# Patient Record
Sex: Female | Born: 1935 | Race: White | Hispanic: No | Smoking: Never smoker
Health system: Southern US, Community
[De-identification: ages and names within clinical notes are randomized; demographics above are authoritative.]

## PROBLEM LIST (undated history)

## (undated) DIAGNOSIS — I5032 Chronic diastolic (congestive) heart failure: Secondary | ICD-10-CM

## (undated) DIAGNOSIS — N183 Chronic kidney disease, stage 3 unspecified: Secondary | ICD-10-CM

## (undated) DIAGNOSIS — T4145XA Adverse effect of unspecified anesthetic, initial encounter: Secondary | ICD-10-CM

## (undated) DIAGNOSIS — I10 Essential (primary) hypertension: Secondary | ICD-10-CM

## (undated) DIAGNOSIS — R7303 Prediabetes: Secondary | ICD-10-CM

## (undated) DIAGNOSIS — T8859XA Other complications of anesthesia, initial encounter: Secondary | ICD-10-CM

## (undated) DIAGNOSIS — R600 Localized edema: Secondary | ICD-10-CM

## (undated) DIAGNOSIS — R011 Cardiac murmur, unspecified: Secondary | ICD-10-CM

## (undated) DIAGNOSIS — R112 Nausea with vomiting, unspecified: Secondary | ICD-10-CM

## (undated) DIAGNOSIS — Z9889 Other specified postprocedural states: Secondary | ICD-10-CM

## (undated) DIAGNOSIS — K7460 Unspecified cirrhosis of liver: Secondary | ICD-10-CM

## (undated) DIAGNOSIS — I350 Nonrheumatic aortic (valve) stenosis: Secondary | ICD-10-CM

## (undated) DIAGNOSIS — D638 Anemia in other chronic diseases classified elsewhere: Secondary | ICD-10-CM

## (undated) DIAGNOSIS — G252 Other specified forms of tremor: Secondary | ICD-10-CM

## (undated) DIAGNOSIS — Z8701 Personal history of pneumonia (recurrent): Secondary | ICD-10-CM

## (undated) DIAGNOSIS — R259 Unspecified abnormal involuntary movements: Secondary | ICD-10-CM

## (undated) DIAGNOSIS — M069 Rheumatoid arthritis, unspecified: Secondary | ICD-10-CM

## (undated) HISTORY — DX: Unspecified cirrhosis of liver: K74.60

## (undated) HISTORY — DX: Nonrheumatic aortic (valve) stenosis: I35.0

## (undated) HISTORY — DX: Rheumatoid arthritis, unspecified: M06.9

## (undated) HISTORY — DX: Chronic kidney disease, stage 3 unspecified: N18.30

## (undated) HISTORY — PX: BREAST LUMPECTOMY: SHX2

## (undated) HISTORY — DX: Personal history of pneumonia (recurrent): Z87.01

## (undated) HISTORY — DX: Chronic kidney disease, stage 3 (moderate): N18.3

## (undated) HISTORY — DX: Unspecified abnormal involuntary movements: R25.9

## (undated) HISTORY — DX: Other specified forms of tremor: G25.2

## (undated) HISTORY — DX: Anemia in other chronic diseases classified elsewhere: D63.8

## (undated) HISTORY — DX: Localized edema: R60.0

## (undated) HISTORY — DX: Essential (primary) hypertension: I10

## (undated) HISTORY — DX: Chronic diastolic (congestive) heart failure: I50.32

## (undated) HISTORY — PX: ABDOMINAL HYSTERECTOMY: SHX81

---

## 1998-05-20 ENCOUNTER — Encounter: Payer: Self-pay | Admitting: *Deleted

## 1998-05-20 ENCOUNTER — Ambulatory Visit (HOSPITAL_COMMUNITY): Admission: RE | Admit: 1998-05-20 | Discharge: 1998-05-20 | Payer: Self-pay | Admitting: *Deleted

## 1998-11-26 ENCOUNTER — Emergency Department (HOSPITAL_COMMUNITY): Admission: EM | Admit: 1998-11-26 | Discharge: 1998-11-26 | Payer: Self-pay | Admitting: Emergency Medicine

## 1998-12-27 ENCOUNTER — Encounter: Admission: RE | Admit: 1998-12-27 | Discharge: 1998-12-27 | Payer: Self-pay | Admitting: Family Medicine

## 1998-12-27 ENCOUNTER — Encounter: Payer: Self-pay | Admitting: Family Medicine

## 1999-06-28 ENCOUNTER — Encounter: Admission: RE | Admit: 1999-06-28 | Discharge: 1999-06-28 | Payer: Self-pay | Admitting: Family Medicine

## 1999-06-28 ENCOUNTER — Encounter: Payer: Self-pay | Admitting: Family Medicine

## 2000-03-14 ENCOUNTER — Other Ambulatory Visit: Admission: RE | Admit: 2000-03-14 | Discharge: 2000-03-14 | Payer: Self-pay | Admitting: *Deleted

## 2000-03-20 ENCOUNTER — Encounter: Payer: Self-pay | Admitting: *Deleted

## 2000-03-20 ENCOUNTER — Ambulatory Visit (HOSPITAL_COMMUNITY): Admission: RE | Admit: 2000-03-20 | Discharge: 2000-03-20 | Payer: Self-pay | Admitting: *Deleted

## 2002-05-01 ENCOUNTER — Ambulatory Visit (HOSPITAL_COMMUNITY): Admission: RE | Admit: 2002-05-01 | Discharge: 2002-05-01 | Payer: Self-pay | Admitting: Family Medicine

## 2004-09-03 ENCOUNTER — Emergency Department (HOSPITAL_COMMUNITY): Admission: EM | Admit: 2004-09-03 | Discharge: 2004-09-04 | Payer: Self-pay | Admitting: Emergency Medicine

## 2008-03-04 ENCOUNTER — Encounter: Admission: RE | Admit: 2008-03-04 | Discharge: 2008-03-04 | Payer: Self-pay | Admitting: Gastroenterology

## 2008-06-24 ENCOUNTER — Ambulatory Visit (HOSPITAL_COMMUNITY): Admission: RE | Admit: 2008-06-24 | Discharge: 2008-06-24 | Payer: Self-pay | Admitting: Family Medicine

## 2008-06-24 ENCOUNTER — Ambulatory Visit: Payer: Self-pay | Admitting: Vascular Surgery

## 2009-10-27 ENCOUNTER — Ambulatory Visit: Payer: Self-pay | Admitting: Internal Medicine

## 2010-01-21 ENCOUNTER — Ambulatory Visit: Payer: Self-pay | Admitting: Cardiology

## 2010-01-29 ENCOUNTER — Encounter: Payer: Self-pay | Admitting: Family Medicine

## 2010-02-03 ENCOUNTER — Ambulatory Visit: Payer: Self-pay | Admitting: Cardiology

## 2010-02-16 ENCOUNTER — Ambulatory Visit: Payer: Self-pay | Admitting: Cardiology

## 2010-02-23 ENCOUNTER — Other Ambulatory Visit: Payer: Self-pay

## 2010-02-23 ENCOUNTER — Encounter (HOSPITAL_COMMUNITY): Payer: Self-pay

## 2010-02-23 ENCOUNTER — Encounter (HOSPITAL_COMMUNITY): Payer: Medicare HMO | Attending: Nephrology

## 2010-02-23 DIAGNOSIS — N183 Chronic kidney disease, stage 3 unspecified: Secondary | ICD-10-CM | POA: Insufficient documentation

## 2010-02-23 DIAGNOSIS — D638 Anemia in other chronic diseases classified elsewhere: Secondary | ICD-10-CM | POA: Insufficient documentation

## 2010-02-23 LAB — POCT HEMOGLOBIN-HEMACUE: Hemoglobin: 9 g/dL — ABNORMAL LOW (ref 12.0–15.0)

## 2010-03-09 ENCOUNTER — Encounter (HOSPITAL_COMMUNITY): Payer: Medicare HMO

## 2010-03-09 ENCOUNTER — Other Ambulatory Visit: Payer: Self-pay

## 2010-03-10 LAB — POCT HEMOGLOBIN-HEMACUE: Hemoglobin: 8.9 g/dL — ABNORMAL LOW (ref 12.0–15.0)

## 2010-03-23 ENCOUNTER — Encounter (HOSPITAL_COMMUNITY): Payer: Medicare HMO

## 2010-03-25 ENCOUNTER — Encounter (HOSPITAL_COMMUNITY): Payer: Medicare HMO | Attending: Nephrology

## 2010-03-25 ENCOUNTER — Other Ambulatory Visit: Payer: Self-pay | Admitting: Nephrology

## 2010-03-25 ENCOUNTER — Other Ambulatory Visit: Payer: Self-pay

## 2010-03-25 DIAGNOSIS — N183 Chronic kidney disease, stage 3 unspecified: Secondary | ICD-10-CM | POA: Insufficient documentation

## 2010-03-25 DIAGNOSIS — D638 Anemia in other chronic diseases classified elsewhere: Secondary | ICD-10-CM | POA: Insufficient documentation

## 2010-03-25 LAB — IRON AND TIBC
Iron: 66 ug/dL (ref 42–135)
Saturation Ratios: 29 % (ref 20–55)
TIBC: 224 ug/dL — ABNORMAL LOW (ref 250–470)

## 2010-04-11 ENCOUNTER — Other Ambulatory Visit: Payer: Self-pay | Admitting: Nephrology

## 2010-04-11 ENCOUNTER — Encounter (HOSPITAL_COMMUNITY): Payer: Medicare HMO | Attending: Nephrology

## 2010-04-11 DIAGNOSIS — N183 Chronic kidney disease, stage 3 unspecified: Secondary | ICD-10-CM | POA: Insufficient documentation

## 2010-04-11 DIAGNOSIS — D638 Anemia in other chronic diseases classified elsewhere: Secondary | ICD-10-CM | POA: Insufficient documentation

## 2010-04-18 ENCOUNTER — Other Ambulatory Visit: Payer: Self-pay | Admitting: Nephrology

## 2010-04-18 ENCOUNTER — Encounter (HOSPITAL_COMMUNITY): Payer: Medicare HMO

## 2010-04-25 ENCOUNTER — Encounter (HOSPITAL_COMMUNITY): Payer: Medicare HMO

## 2010-04-25 ENCOUNTER — Other Ambulatory Visit: Payer: Self-pay | Admitting: Nephrology

## 2010-05-02 ENCOUNTER — Other Ambulatory Visit: Payer: Self-pay | Admitting: Nephrology

## 2010-05-02 ENCOUNTER — Encounter (HOSPITAL_COMMUNITY): Payer: Medicare HMO

## 2010-05-02 LAB — FERRITIN: Ferritin: 24 ng/mL (ref 10–291)

## 2010-05-02 LAB — IRON AND TIBC
Iron: 23 ug/dL — ABNORMAL LOW (ref 42–135)
TIBC: 261 ug/dL (ref 250–470)
UIBC: 238 ug/dL

## 2010-05-05 ENCOUNTER — Other Ambulatory Visit: Payer: Self-pay | Admitting: Cardiology

## 2010-05-05 DIAGNOSIS — I10 Essential (primary) hypertension: Secondary | ICD-10-CM

## 2010-05-06 NOTE — Telephone Encounter (Signed)
escribe medication per fax request  

## 2010-05-09 ENCOUNTER — Encounter (HOSPITAL_COMMUNITY): Payer: Medicare HMO

## 2010-05-09 ENCOUNTER — Other Ambulatory Visit: Payer: Self-pay | Admitting: Nephrology

## 2010-05-16 ENCOUNTER — Other Ambulatory Visit: Payer: Self-pay | Admitting: Nephrology

## 2010-05-16 ENCOUNTER — Encounter (HOSPITAL_COMMUNITY): Payer: Medicare HMO | Attending: Nephrology

## 2010-05-16 DIAGNOSIS — N183 Chronic kidney disease, stage 3 unspecified: Secondary | ICD-10-CM | POA: Insufficient documentation

## 2010-05-16 DIAGNOSIS — D638 Anemia in other chronic diseases classified elsewhere: Secondary | ICD-10-CM | POA: Insufficient documentation

## 2010-05-17 LAB — POCT HEMOGLOBIN-HEMACUE: Hemoglobin: 9.5 g/dL — ABNORMAL LOW (ref 12.0–15.0)

## 2010-05-23 ENCOUNTER — Encounter: Payer: Self-pay | Admitting: Nephrology

## 2010-05-23 ENCOUNTER — Encounter (HOSPITAL_COMMUNITY): Payer: Medicare HMO

## 2010-05-30 ENCOUNTER — Other Ambulatory Visit: Payer: Self-pay | Admitting: Nephrology

## 2010-05-30 ENCOUNTER — Encounter (HOSPITAL_COMMUNITY): Payer: Medicare HMO

## 2010-05-30 LAB — IRON AND TIBC
Iron: 139 ug/dL — ABNORMAL HIGH (ref 42–135)
UIBC: <55 ug/dL

## 2010-05-30 LAB — FERRITIN: Ferritin: 499 ng/mL — ABNORMAL HIGH (ref 10–291)

## 2010-05-31 LAB — POCT HEMOGLOBIN-HEMACUE: Hemoglobin: 10.1 g/dL — ABNORMAL LOW (ref 12.0–15.0)

## 2010-06-07 ENCOUNTER — Encounter (HOSPITAL_COMMUNITY): Payer: Medicare HMO

## 2010-06-07 ENCOUNTER — Other Ambulatory Visit: Payer: Self-pay | Admitting: Nephrology

## 2010-06-07 LAB — POCT HEMOGLOBIN-HEMACUE: Hemoglobin: 10.7 g/dL — ABNORMAL LOW (ref 12.0–15.0)

## 2010-06-13 ENCOUNTER — Encounter (HOSPITAL_COMMUNITY): Payer: Medicare HMO | Attending: Nephrology

## 2010-06-13 ENCOUNTER — Other Ambulatory Visit: Payer: Self-pay | Admitting: Nephrology

## 2010-06-13 DIAGNOSIS — D638 Anemia in other chronic diseases classified elsewhere: Secondary | ICD-10-CM | POA: Insufficient documentation

## 2010-06-13 DIAGNOSIS — N183 Chronic kidney disease, stage 3 unspecified: Secondary | ICD-10-CM | POA: Insufficient documentation

## 2010-06-14 LAB — POCT HEMOGLOBIN-HEMACUE: Hemoglobin: 10.4 g/dL — ABNORMAL LOW (ref 12.0–15.0)

## 2010-06-20 ENCOUNTER — Encounter (HOSPITAL_COMMUNITY): Payer: Medicare HMO

## 2010-06-20 ENCOUNTER — Other Ambulatory Visit: Payer: Self-pay | Admitting: Nephrology

## 2010-06-29 ENCOUNTER — Encounter: Payer: Self-pay | Admitting: *Deleted

## 2010-07-04 ENCOUNTER — Other Ambulatory Visit: Payer: Self-pay | Admitting: Nephrology

## 2010-07-04 ENCOUNTER — Encounter (HOSPITAL_COMMUNITY)
Admission: RE | Admit: 2010-07-04 | Discharge: 2010-07-04 | Payer: Medicare HMO | Source: Ambulatory Visit | Attending: Nephrology | Admitting: Nephrology

## 2010-07-04 LAB — FERRITIN: Ferritin: 390 ng/mL — ABNORMAL HIGH (ref 10–291)

## 2010-07-04 LAB — IRON AND TIBC: TIBC: 196 ug/dL — ABNORMAL LOW (ref 250–470)

## 2010-07-06 ENCOUNTER — Ambulatory Visit (INDEPENDENT_AMBULATORY_CARE_PROVIDER_SITE_OTHER): Payer: Medicare HMO | Admitting: Cardiology

## 2010-07-06 ENCOUNTER — Encounter: Payer: Self-pay | Admitting: Cardiology

## 2010-07-06 DIAGNOSIS — K7460 Unspecified cirrhosis of liver: Secondary | ICD-10-CM | POA: Insufficient documentation

## 2010-07-06 DIAGNOSIS — I359 Nonrheumatic aortic valve disorder, unspecified: Secondary | ICD-10-CM

## 2010-07-06 DIAGNOSIS — I10 Essential (primary) hypertension: Secondary | ICD-10-CM | POA: Insufficient documentation

## 2010-07-06 DIAGNOSIS — I350 Nonrheumatic aortic (valve) stenosis: Secondary | ICD-10-CM

## 2010-07-06 NOTE — Patient Instructions (Signed)
Continue your current medications.   Continue sodium restriction.  I will see you again in 6 months.

## 2010-07-06 NOTE — Assessment & Plan Note (Addendum)
She is asymptomatic from a cardiac standpoint. I explained to the daughter the daughter was important to maintain her hemoglobin up since this will help reduce the strain on her heart. She is not a candidate for aortic valve replacement given her multiple comorbidities. I will plan on followup again in 6 months.

## 2010-07-06 NOTE — Assessment & Plan Note (Signed)
Blood pressure is doing very well. I have recommended continued sodium restriction.

## 2010-07-06 NOTE — Progress Notes (Signed)
   [name] Date of Birth: [birth year]   History of Present Illness: Mrs. Bratcher is seen today for followup. She reports that she is now getting Procrit injections for her anemia. She denies any symptoms of chest pain or shortness of breath. She denies any palpitations. She feels that her energy level is fairly good. She still has swelling in her abdomen but her lower extremity edema has been stable. Her weight has been stable.  Current Outpatient Prescriptions on File Prior to Visit  Medication Sig Dispense Refill  . amLODipine (NORVASC) 2.5 MG tablet Take 2.5 mg by mouth daily.        Marland Kitchen aspirin 81 MG EC tablet Take 81 mg by mouth daily.        Marland Kitchen Epoetin Alfa (PROCRIT IJ) Inject as directed once a week.        . ferrous gluconate (FERGON) 324 MG tablet Take 324 mg by mouth daily with breakfast.        . furosemide (LASIX) 20 MG tablet Take 20 mg by mouth daily.        Marland Kitchen lisinopril (PRINIVIL,ZESTRIL) 20 MG tablet TAKE 1 TABLET BY MOUTH TWICE A DAY  60 tablet  5  . spironolactone (ALDACTONE) 25 MG tablet Take 25 mg by mouth 4 (four) times daily.          Allergies  Allergen Reactions  . Aspirin   . Codeine   . Penicillins   . Sulfa Drugs Cross Reactors     Past Medical History  Diagnosis Date  . Hypertension   . Aortic stenosis, moderate     moderate to severe  . Rheumatoid arthritis   . Asthma   . Hepatic cirrhosis   . Chest pain   . Anemia   . Shortness of breath   . Dizziness     Past Surgical History  Procedure Date  . Abdominal hysterectomy   . Breast lumpectomy     right    History  Smoking status  . Never Smoker   Smokeless tobacco  . Not on file    History  Alcohol Use No    Family History  Problem Relation Age of Onset  . Heart attack Brother   . Cancer Brother   . Cancer Brother   . Aortic aneurysm Brother   . Cancer Sister   . Cancer Sister   . Cancer Sister   . Goiter Mother     Review of Systems: The review of systems is  positive for decreased appetite.  All other systems were reviewed and are negative.  Physical Exam: BP 124/56  Pulse 64  Ht 5\' 1"  (1.549 m)  Wt 175 lb (79.379 kg)  BMI 33.07 kg/m2 She is an elderly white female who appears chronically ill. She is normocephalic, atraumatic. Pupils are equal round and reactive to light and accommodation. She has no JVD or bruits. Her carotid upstrokes are fairly well preserved. Lungs are clear. Cardiac exam reveals a harsh grade 2/6 systolic murmur in the right upper sternal border radiating to the left sternal border. Abdomen is soft, distended. Cannot rule out ascites. She has 1-2+ lower extremity edema. Skin is warm and dry. Her neurologic exam is intact. LABORATORY DATA:   Assessment / Plan:

## 2010-07-11 ENCOUNTER — Encounter (HOSPITAL_COMMUNITY): Payer: Medicare HMO | Attending: Nephrology

## 2010-07-11 ENCOUNTER — Other Ambulatory Visit: Payer: Self-pay | Admitting: Nephrology

## 2010-07-11 DIAGNOSIS — D638 Anemia in other chronic diseases classified elsewhere: Secondary | ICD-10-CM | POA: Insufficient documentation

## 2010-07-11 DIAGNOSIS — N183 Chronic kidney disease, stage 3 unspecified: Secondary | ICD-10-CM | POA: Insufficient documentation

## 2010-07-12 LAB — POCT HEMOGLOBIN-HEMACUE: Hemoglobin: 10.1 g/dL — ABNORMAL LOW (ref 12.0–15.0)

## 2010-07-18 ENCOUNTER — Encounter (HOSPITAL_COMMUNITY): Payer: Medicare HMO

## 2010-07-18 ENCOUNTER — Other Ambulatory Visit: Payer: Self-pay | Admitting: Nephrology

## 2010-07-18 LAB — IRON AND TIBC: TIBC: 176 ug/dL — ABNORMAL LOW (ref 250–470)

## 2010-07-25 ENCOUNTER — Other Ambulatory Visit: Payer: Self-pay | Admitting: Nephrology

## 2010-07-25 ENCOUNTER — Encounter (HOSPITAL_COMMUNITY): Payer: Medicare HMO

## 2010-07-25 LAB — POCT HEMOGLOBIN-HEMACUE: Hemoglobin: 10.6 g/dL — ABNORMAL LOW (ref 12.0–15.0)

## 2010-08-01 ENCOUNTER — Encounter (HOSPITAL_COMMUNITY): Payer: Medicare HMO

## 2010-08-01 ENCOUNTER — Other Ambulatory Visit: Payer: Self-pay | Admitting: Nephrology

## 2010-08-15 ENCOUNTER — Other Ambulatory Visit: Payer: Self-pay | Admitting: Nephrology

## 2010-08-15 ENCOUNTER — Encounter (HOSPITAL_COMMUNITY): Payer: Medicare HMO | Attending: Nephrology

## 2010-08-15 DIAGNOSIS — N183 Chronic kidney disease, stage 3 unspecified: Secondary | ICD-10-CM | POA: Insufficient documentation

## 2010-08-15 DIAGNOSIS — D638 Anemia in other chronic diseases classified elsewhere: Secondary | ICD-10-CM | POA: Insufficient documentation

## 2010-08-15 LAB — POCT HEMOGLOBIN-HEMACUE: Hemoglobin: 10.7 g/dL — ABNORMAL LOW (ref 12.0–15.0)

## 2010-08-15 LAB — IRON AND TIBC: Iron: 107 ug/dL (ref 42–135)

## 2010-08-15 LAB — FERRITIN: Ferritin: 420 ng/mL — ABNORMAL HIGH (ref 10–291)

## 2010-08-22 ENCOUNTER — Other Ambulatory Visit: Payer: Self-pay | Admitting: Nephrology

## 2010-08-22 ENCOUNTER — Encounter (HOSPITAL_COMMUNITY): Payer: Medicare HMO

## 2010-08-29 ENCOUNTER — Encounter (HOSPITAL_COMMUNITY): Payer: Medicare HMO

## 2010-08-29 ENCOUNTER — Other Ambulatory Visit: Payer: Self-pay | Admitting: Nephrology

## 2010-08-30 LAB — POCT HEMOGLOBIN-HEMACUE: Hemoglobin: 10.6 g/dL — ABNORMAL LOW (ref 12.0–15.0)

## 2010-09-05 ENCOUNTER — Encounter (HOSPITAL_COMMUNITY): Payer: Medicare HMO

## 2010-09-05 ENCOUNTER — Other Ambulatory Visit: Payer: Self-pay | Admitting: Nephrology

## 2010-09-05 LAB — POCT HEMOGLOBIN-HEMACUE: Hemoglobin: 11 g/dL — ABNORMAL LOW (ref 12.0–15.0)

## 2010-09-13 ENCOUNTER — Encounter (HOSPITAL_COMMUNITY): Payer: Medicare HMO

## 2010-09-19 ENCOUNTER — Other Ambulatory Visit: Payer: Self-pay | Admitting: Nephrology

## 2010-09-19 ENCOUNTER — Encounter (HOSPITAL_COMMUNITY): Payer: Medicare HMO | Attending: Nephrology

## 2010-09-19 DIAGNOSIS — N183 Chronic kidney disease, stage 3 unspecified: Secondary | ICD-10-CM | POA: Insufficient documentation

## 2010-09-19 DIAGNOSIS — D638 Anemia in other chronic diseases classified elsewhere: Secondary | ICD-10-CM | POA: Insufficient documentation

## 2010-09-19 LAB — RENAL FUNCTION PANEL
Albumin: 2.7 g/dL — ABNORMAL LOW (ref 3.5–5.2)
BUN: 17 mg/dL (ref 6–23)
Calcium: 9.1 mg/dL (ref 8.4–10.5)
Chloride: 110 mEq/L (ref 96–112)
Creatinine, Ser: 1.42 mg/dL — ABNORMAL HIGH (ref 0.50–1.10)

## 2010-09-19 LAB — POCT HEMOGLOBIN-HEMACUE: Hemoglobin: 10.8 g/dL — ABNORMAL LOW (ref 12.0–15.0)

## 2010-09-20 LAB — IRON AND TIBC
Saturation Ratios: 55 % (ref 20–55)
TIBC: 188 ug/dL — ABNORMAL LOW (ref 250–470)
UIBC: 85 ug/dL — ABNORMAL LOW (ref 125–400)

## 2010-09-20 LAB — FERRITIN: Ferritin: 334 ng/mL — ABNORMAL HIGH (ref 10–291)

## 2010-09-20 LAB — PTH, INTACT AND CALCIUM: Calcium, Total (PTH): 8.8 mg/dL (ref 8.4–10.5)

## 2010-09-26 ENCOUNTER — Encounter (HOSPITAL_COMMUNITY): Payer: Medicare HMO

## 2010-09-26 ENCOUNTER — Other Ambulatory Visit: Payer: Self-pay | Admitting: Nephrology

## 2010-09-26 LAB — POCT HEMOGLOBIN-HEMACUE: Hemoglobin: 11.7 g/dL — ABNORMAL LOW (ref 12.0–15.0)

## 2010-10-10 ENCOUNTER — Other Ambulatory Visit: Payer: Self-pay | Admitting: Nephrology

## 2010-10-10 ENCOUNTER — Encounter (HOSPITAL_COMMUNITY)
Admission: RE | Admit: 2010-10-10 | Discharge: 2010-10-10 | Disposition: A | Discharge status: Home / Self Care | Payer: Medicare HMO | Source: Ambulatory Visit | Attending: Nephrology | Admitting: Nephrology

## 2010-10-10 DIAGNOSIS — N183 Chronic kidney disease, stage 3 unspecified: Secondary | ICD-10-CM | POA: Insufficient documentation

## 2010-10-10 DIAGNOSIS — D638 Anemia in other chronic diseases classified elsewhere: Secondary | ICD-10-CM | POA: Insufficient documentation

## 2010-10-11 LAB — POCT HEMOGLOBIN-HEMACUE: Hemoglobin: 10.2 g/dL — ABNORMAL LOW (ref 12.0–15.0)

## 2010-10-17 ENCOUNTER — Other Ambulatory Visit: Payer: Self-pay | Admitting: Nephrology

## 2010-10-17 ENCOUNTER — Encounter (HOSPITAL_COMMUNITY): Payer: Medicare HMO

## 2010-10-17 LAB — BASIC METABOLIC PANEL
Chloride: 104 mEq/L (ref 96–112)
GFR calc Af Amer: 21 mL/min — ABNORMAL LOW (ref >90)
Potassium: 4.8 mEq/L (ref 3.5–5.1)

## 2010-10-17 LAB — IRON AND TIBC
Iron: 49 ug/dL (ref 42–135)
Saturation Ratios: 25 % (ref 20–55)
TIBC: 199 ug/dL — ABNORMAL LOW (ref 250–470)
UIBC: 150 ug/dL (ref 125–400)

## 2010-10-24 ENCOUNTER — Other Ambulatory Visit: Payer: Self-pay | Admitting: Nephrology

## 2010-10-24 ENCOUNTER — Encounter (HOSPITAL_COMMUNITY): Payer: Medicare HMO

## 2010-10-24 LAB — BASIC METABOLIC PANEL
BUN: 16 mg/dL (ref 6–23)
Calcium: 8.8 mg/dL (ref 8.4–10.5)
Creatinine, Ser: 1.6 mg/dL — ABNORMAL HIGH (ref 0.50–1.10)
GFR calc non Af Amer: 29 mL/min — ABNORMAL LOW (ref >90)
Glucose, Bld: 115 mg/dL — ABNORMAL HIGH (ref 70–99)

## 2010-10-31 ENCOUNTER — Encounter (HOSPITAL_COMMUNITY): Payer: Medicare HMO

## 2010-10-31 ENCOUNTER — Other Ambulatory Visit: Payer: Self-pay | Admitting: Nephrology

## 2010-11-07 ENCOUNTER — Other Ambulatory Visit: Payer: Self-pay | Admitting: Nephrology

## 2010-11-07 ENCOUNTER — Encounter (HOSPITAL_COMMUNITY): Payer: Medicare HMO

## 2010-11-07 LAB — IRON AND TIBC
Saturation Ratios: 32 % (ref 20–55)
UIBC: 123 ug/dL — ABNORMAL LOW (ref 125–400)

## 2010-11-14 ENCOUNTER — Encounter (HOSPITAL_COMMUNITY): Admission: RE | Admit: 2010-11-14 | Payer: Medicare HMO | Source: Ambulatory Visit

## 2010-11-14 ENCOUNTER — Encounter (HOSPITAL_COMMUNITY): Payer: Medicare HMO

## 2010-11-14 DIAGNOSIS — D638 Anemia in other chronic diseases classified elsewhere: Secondary | ICD-10-CM | POA: Insufficient documentation

## 2010-11-14 DIAGNOSIS — N183 Chronic kidney disease, stage 3 unspecified: Secondary | ICD-10-CM | POA: Insufficient documentation

## 2010-11-14 LAB — POCT HEMOGLOBIN-HEMACUE: Hemoglobin: 11.3 g/dL — ABNORMAL LOW (ref 12.0–15.0)

## 2010-11-14 MED ORDER — EPOETIN ALFA 10000 UNIT/ML IJ SOLN: 20000.0000 [IU] | INTRAMUSCULAR | Status: DC

## 2010-11-17 MED FILL — Epoetin Alfa Inj 20000 Unit/ML: INTRAMUSCULAR | Qty: 1 | Status: AC

## 2010-11-21 ENCOUNTER — Encounter (HOSPITAL_COMMUNITY)
Admission: RE | Admit: 2010-11-21 | Discharge: 2010-11-21 | Disposition: A | Discharge status: Home / Self Care | Payer: Medicare HMO | Source: Ambulatory Visit | Attending: Nephrology | Admitting: Nephrology

## 2010-11-21 MED ORDER — EPOETIN ALFA 10000 UNIT/ML IJ SOLN: 20000.0000 [IU] | INTRAMUSCULAR | Status: DC

## 2010-11-21 MED ORDER — EPOETIN ALFA 20000 UNIT/ML IJ SOLN
INTRAMUSCULAR | Status: AC
  Administered 2010-11-21: 20000 [IU] via SUBCUTANEOUS
  Filled 2010-11-21: qty 1

## 2010-11-24 ENCOUNTER — Encounter (HOSPITAL_COMMUNITY): Payer: Medicare HMO

## 2010-11-28 ENCOUNTER — Encounter (HOSPITAL_COMMUNITY): Payer: Medicare HMO

## 2010-12-05 ENCOUNTER — Encounter (HOSPITAL_COMMUNITY)
Admission: RE | Admit: 2010-12-05 | Discharge: 2010-12-05 | Disposition: A | Discharge status: Home / Self Care | Payer: Medicare HMO | Source: Ambulatory Visit | Attending: Nephrology | Admitting: Nephrology

## 2010-12-05 LAB — FERRITIN: Ferritin: 311 ng/mL — ABNORMAL HIGH (ref 10–291)

## 2010-12-05 LAB — IRON AND TIBC: Iron: 95 ug/dL (ref 42–135)

## 2010-12-05 MED ORDER — EPOETIN ALFA 10000 UNIT/ML IJ SOLN: 20000.0000 [IU] | INTRAMUSCULAR | Status: DC

## 2010-12-05 MED ORDER — EPOETIN ALFA 20000 UNIT/ML IJ SOLN
INTRAMUSCULAR | Status: AC
  Administered 2010-12-05: 15:00:00 via SUBCUTANEOUS
  Filled 2010-12-05: qty 1

## 2010-12-10 DIAGNOSIS — Z8701 Personal history of pneumonia (recurrent): Secondary | ICD-10-CM

## 2010-12-10 HISTORY — DX: Personal history of pneumonia (recurrent): Z87.01

## 2010-12-12 ENCOUNTER — Encounter (HOSPITAL_COMMUNITY)
Admission: RE | Admit: 2010-12-12 | Discharge: 2010-12-12 | Disposition: A | Discharge status: Home / Self Care | Payer: Medicare HMO | Source: Ambulatory Visit | Attending: Nephrology | Admitting: Nephrology

## 2010-12-12 ENCOUNTER — Other Ambulatory Visit: Payer: Self-pay | Admitting: Cardiology

## 2010-12-12 DIAGNOSIS — D638 Anemia in other chronic diseases classified elsewhere: Secondary | ICD-10-CM | POA: Insufficient documentation

## 2010-12-12 DIAGNOSIS — N183 Chronic kidney disease, stage 3 unspecified: Secondary | ICD-10-CM | POA: Insufficient documentation

## 2010-12-12 MED ORDER — EPOETIN ALFA 20000 UNIT/ML IJ SOLN
INTRAMUSCULAR | Status: AC
  Administered 2010-12-12: 20000 [IU] via SUBCUTANEOUS
  Filled 2010-12-12: qty 1

## 2010-12-12 MED ORDER — EPOETIN ALFA 10000 UNIT/ML IJ SOLN: 20000.0000 [IU] | INTRAMUSCULAR | Status: DC

## 2010-12-19 ENCOUNTER — Encounter (HOSPITAL_COMMUNITY): Payer: Medicare HMO

## 2010-12-26 ENCOUNTER — Telehealth: Payer: Self-pay | Admitting: Cardiology

## 2010-12-26 ENCOUNTER — Encounter (HOSPITAL_COMMUNITY): Payer: Medicare HMO

## 2010-12-26 NOTE — Telephone Encounter (Signed)
lmtcb

## 2010-12-26 NOTE — Telephone Encounter (Signed)
FU CALL She had echo at [facility]

## 2010-12-26 NOTE — Telephone Encounter (Signed)
Pt's dtr robin calling re pt being dc today from hospital, has pneumonia and was told needs to fu with [place] before 12-31 her echo was abnormal, his schedule is full

## 2010-12-26 NOTE — Telephone Encounter (Signed)
Daughter [name]) called stating her Mom was in [facility] and had an Echo. Advised her to call them to have them fax results to [place] and d/c summary. [name] has an app with Dr. [place]and 12/31. Dr. [place] felt it was OK to wait until then to be seen

## 2011-01-04 ENCOUNTER — Other Ambulatory Visit: Payer: Self-pay | Admitting: *Deleted

## 2011-01-04 MED ORDER — AMLODIPINE BESYLATE 5 MG PO TABS: 2.5000 mg | ORAL_TABLET | Freq: Every day | ORAL | Status: DC

## 2011-01-09 ENCOUNTER — Encounter: Payer: Self-pay | Admitting: Cardiology

## 2011-01-09 ENCOUNTER — Ambulatory Visit (INDEPENDENT_AMBULATORY_CARE_PROVIDER_SITE_OTHER): Payer: Medicare HMO | Admitting: Cardiology

## 2011-01-09 VITALS — BP 126/74 | HR 56 | Ht 63.0 in | Wt 165.6 lb

## 2011-01-09 DIAGNOSIS — I350 Nonrheumatic aortic (valve) stenosis: Secondary | ICD-10-CM

## 2011-01-09 DIAGNOSIS — I359 Nonrheumatic aortic valve disorder, unspecified: Secondary | ICD-10-CM

## 2011-01-09 DIAGNOSIS — I5032 Chronic diastolic (congestive) heart failure: Secondary | ICD-10-CM | POA: Insufficient documentation

## 2011-01-09 DIAGNOSIS — I509 Heart failure, unspecified: Secondary | ICD-10-CM

## 2011-01-09 DIAGNOSIS — J189 Pneumonia, unspecified organism: Secondary | ICD-10-CM

## 2011-01-09 NOTE — Assessment & Plan Note (Signed)
I think her problems during her recent hospital stay were predominantly related to pneumonia. She appears to be euvolemic at this time. I recommended continuing her current diuretic dose. I will followup again in 6 months.

## 2011-01-09 NOTE — Progress Notes (Signed)
Jennifer Neal Date of Birth: 1927/07/11   History of Present Illness: Jennifer Neal is seen today for followup. She was recently admitted to [facility] from December 13 through the 18th with an acute pneumonia. There was felt to be some diastolic heart failure as well. She was treated with antibiotics with significant improvement. She was febrile and had an elevated white count. She did have an echocardiogram during that hospital stay which demonstrated mild concentric LVH with normal systolic function. Ejection fraction was 60-65%. There was grade 2 diastolic dysfunction. The left atrium was markedly dilated. There was moderate aortic stenosis with a peak gradient of 52 mm of mercury and mean gradient of 33 mm of mercury. There was mild to moderate pulmonary hypertension. Compared to her previous echocardiogram report in February 2011 there does not appear to be any significant change. Since discharge she has done very well. Her breathing is okay now. She still has a mild hacking cough. She does have some swelling in her right foot. Her weight has decreased by 10 pounds since her last visit here.  Current Outpatient Prescriptions on File Prior to Visit  Medication Sig Dispense Refill  . amLODipine (NORVASC) 5 MG tablet Take 0.5 tablets (2.5 mg total) by mouth daily.  30 tablet  5  . aspirin 81 MG EC tablet Take 81 mg by mouth daily.        Marland Kitchen Epoetin Alfa (PROCRIT IJ) Inject as directed once a week.        . ferrous gluconate (FERGON) 324 MG tablet Take 324 mg by mouth daily with breakfast.        . furosemide (LASIX) 20 MG tablet Take 20 mg by mouth daily.        Marland Kitchen lisinopril (PRINIVIL,ZESTRIL) 20 MG tablet TAKE 1 TABLET BY MOUTH TWICE A DAY  60 tablet  5  . spironolactone (ALDACTONE) 25 MG tablet Take 25 mg by mouth 4 (four) times daily.         Current Facility-Administered Medications on File Prior to Visit  Medication Dose Route Frequency Provider Last Rate Last Dose  .  epoetin alfa (EPOGEN,PROCRIT) injection 20,000 Units  20,000 Units Subcutaneous Weekly Jay K. Allena Katz, MD        Allergies  Allergen Reactions  . Aspirin   . Codeine   . Penicillins   . Sulfa Drugs Cross Reactors     Past Medical History  Diagnosis Date  . Hypertension   . Aortic stenosis, moderate     moderate to severe  . Rheumatoid arthritis   . Asthma   . Hepatic cirrhosis   . Chest pain   . Anemia   . Shortness of breath   . Dizziness   . CKD (chronic kidney disease), stage III   . PNA (pneumonia)   . Diastolic CHF, chronic     Past Surgical History  Procedure Date  . Abdominal hysterectomy   . Breast lumpectomy     right    History  Smoking status  . Never Smoker   Smokeless tobacco  . Not on file    History  Alcohol Use No    Family History  Problem Relation Age of Onset  . Heart attack Brother   . Cancer Brother   . Cancer Brother   . Aortic aneurysm Brother   . Cancer Sister   . Cancer Sister   . Cancer Sister   . Goiter Mother     Review of Systems: The review  of systems is positive for a mild cough.  All other systems were reviewed and are negative.  Physical Exam: BP 126/74  Pulse 56  Ht 5\' 3"  (1.6 m)  Wt 165 lb 9.6 oz (75.116 kg)  BMI 29.33 kg/m2 She is an elderly white female who appears chronically ill. She is normocephalic, atraumatic. Pupils are equal round and reactive to light and accommodation. She has no JVD or bruits. Her carotid upstrokes are fairly well preserved. Lungs are clear. Cardiac exam reveals a harsh grade 2/6 systolic murmur in the right upper sternal border radiating to the left sternal border. Abdomen is soft, distended. Cannot rule out ascites. She has 1+ lower extremity edema on the right. Skin is warm and dry. Her neurologic exam is intact. LABORATORY DATA: ECG today demonstrates normal sinus rhythm with significant artifact from tremor. She has poor wave progression in the anterior precordial  leads.  Assessment / Plan:

## 2011-01-09 NOTE — Patient Instructions (Signed)
Continue your current medication.  Avoid sodium.  I will see you again in 6 months.

## 2011-01-09 NOTE — Assessment & Plan Note (Signed)
Recent echocardiogram in November showed no significant change compared to February 2011. She is a poor candidate for aortic valve replacement. We will continue to manage medically.

## 2011-01-11 ENCOUNTER — Telehealth: Payer: Self-pay | Admitting: Cardiology

## 2011-01-11 NOTE — Telephone Encounter (Signed)
New Problem:     Patient's nurse called wanting to know if the patient should still be on fpironolactone because the paperwork she was faxed still had it on there as a current medication.  Would also like some orders to be placed so they can do some follow-ups visits with her. Please advise.

## 2011-01-11 NOTE — Telephone Encounter (Signed)
Annice Pih from [facility] called wanting to know if patient is to continue Aldactone.Advised Dr.Jordan's note from 01/09/11,says to continue current diuretic dose.Also wants to continue home health 3 or 4 more visits.

## 2011-01-12 NOTE — Telephone Encounter (Signed)
Annice Pih from [facility] called,okay with Dr.Jordan to extend visits 3 to 4 more times.

## 2011-02-14 ENCOUNTER — Encounter: Payer: Self-pay | Admitting: Nurse Practitioner

## 2011-02-14 ENCOUNTER — Ambulatory Visit
Admission: RE | Admit: 2011-02-14 | Discharge: 2011-02-14 | Disposition: A | Discharge status: Home / Self Care | Payer: Medicare HMO | Source: Ambulatory Visit | Attending: Nurse Practitioner | Admitting: Nurse Practitioner

## 2011-02-14 ENCOUNTER — Ambulatory Visit (INDEPENDENT_AMBULATORY_CARE_PROVIDER_SITE_OTHER): Payer: Medicare HMO | Admitting: Nurse Practitioner

## 2011-02-14 ENCOUNTER — Telehealth: Payer: Self-pay | Admitting: Cardiology

## 2011-02-14 VITALS — BP 112/42 | HR 66 | Temp 98.9°F | Ht 63.0 in | Wt 171.0 lb

## 2011-02-14 DIAGNOSIS — I359 Nonrheumatic aortic valve disorder, unspecified: Secondary | ICD-10-CM

## 2011-02-14 DIAGNOSIS — R609 Edema, unspecified: Secondary | ICD-10-CM

## 2011-02-14 DIAGNOSIS — R509 Fever, unspecified: Secondary | ICD-10-CM

## 2011-02-14 DIAGNOSIS — I350 Nonrheumatic aortic (valve) stenosis: Secondary | ICD-10-CM

## 2011-02-14 LAB — BASIC METABOLIC PANEL
BUN: 16 mg/dL (ref 6–23)
CO2: 27 mEq/L (ref 19–32)
Calcium: 7.8 mg/dL — ABNORMAL LOW (ref 8.4–10.5)
Chloride: 107 mEq/L (ref 96–112)
Creatinine, Ser: 1.5 mg/dL — ABNORMAL HIGH (ref 0.4–1.2)
GFR: 35.66 mL/min — ABNORMAL LOW (ref >60.00)
Glucose, Bld: 148 mg/dL — ABNORMAL HIGH (ref 70–99)
Potassium: 4.1 mEq/L (ref 3.5–5.1)
Sodium: 139 mEq/L (ref 135–145)

## 2011-02-14 LAB — CBC WITH DIFFERENTIAL/PLATELET
Basophils Absolute: 0 10*3/uL (ref 0.0–0.1)
Basophils Relative: 0.8 % (ref 0.0–3.0)
Eosinophils Absolute: 0.2 10*3/uL (ref 0.0–0.7)
Eosinophils Relative: 4.3 % (ref 0.0–5.0)
HCT: 24.2 % — ABNORMAL LOW (ref 36.0–46.0)
Hemoglobin: 8.3 g/dL — ABNORMAL LOW (ref 12.0–15.0)
Lymphocytes Relative: 15 % (ref 12.0–46.0)
Lymphs Abs: 0.9 10*3/uL (ref 0.7–4.0)
MCHC: 34.4 g/dL (ref 30.0–36.0)
MCV: 96.8 fl (ref 78.0–100.0)
Monocytes Absolute: 1.6 10*3/uL — ABNORMAL HIGH (ref 0.1–1.0)
Monocytes Relative: 27.9 % — ABNORMAL HIGH (ref 3.0–12.0)
Neutro Abs: 3 10*3/uL (ref 1.4–7.7)
Neutrophils Relative %: 52 % (ref 43.0–77.0)
Platelets: 114 10*3/uL — ABNORMAL LOW (ref 150.0–400.0)
RBC: 2.5 Mil/uL — ABNORMAL LOW (ref 3.87–5.11)
RDW: 16.2 % — ABNORMAL HIGH (ref 11.5–14.6)
WBC: 5.8 10*3/uL (ref 4.5–10.5)

## 2011-02-14 LAB — BRAIN NATRIURETIC PEPTIDE: Pro B Natriuretic peptide (BNP): 280 pg/mL — ABNORMAL HIGH (ref 0.0–100.0)

## 2011-02-14 MED ORDER — AZITHROMYCIN 250 MG PO TABS: ORAL_TABLET | ORAL | Status: AC

## 2011-02-14 MED ORDER — FUROSEMIDE 20 MG PO TABS: 40.0000 mg | ORAL_TABLET | Freq: Every day | ORAL | Status: DC

## 2011-02-14 NOTE — Assessment & Plan Note (Addendum)
I do not see any signs of cellulitis in her legs. No red streaks. She is warm to touch all over but has no fever here in the office today. She does have more edema and her weight is up. She has probably had increased salt intake.  We will check labs today to include a CBC and see what her white count and hemoglobin are and send her for a CXR today. I have put her on 40 mg of Lasix. Have asked her to try and minimize her salt and elevate her legs if possible. Further disposition to follow. Patient is agreeable to this plan and will call if any problems develop in the interim.   CXR showing probable bronchitis and a ? Soft tissue mass in the mid lower back. White count is 5.8. Hemoglobin is 8.3. We will give her a course of antibiotics. I have asked her daughter to let Dr. Allena Katz know about her low hemoglobin. She is probably due for a Procrit shot. Will need her PCP to look at her back.

## 2011-02-14 NOTE — Progress Notes (Signed)
Addended by: Rosalio Macadamia on: 02/14/2011 04:29 PM   Modules accepted: Orders

## 2011-02-14 NOTE — Telephone Encounter (Signed)
Pt has edema in her legs pt has gained 5lbs over night and on her right leg she has a spot that is warm to the touch, red and swollen and she fell last night said her legs gave way. She wants her mom seen today by Dr. [place].

## 2011-02-14 NOTE — Telephone Encounter (Signed)
App made with Norma Fredrickson NP.

## 2011-02-14 NOTE — Assessment & Plan Note (Signed)
No cardinal symptoms reported.  

## 2011-02-14 NOTE — Progress Notes (Addendum)
Jennifer Neal Date of Birth: 02/04/1927 Medical Record #[id number]  History of Present Illness: Jennifer Neal is seen today for a work in visit. She is seen for Dr. [place]. She is here with Jennifer Neal [phone]). Zella Neal called earlier today because of concerns for cellulitis and swelling. She has had more lower extremity edema. Salt use is questionable. She is not short of breath. Still has a little bit of a cough left over from Jennifer pneumonia back in December. Family noticed one red spot that was warm to touch on the left lower leg. It is not present today. Zella Neal reports that Jennifer mom had a fever of 101 yesterday. She has no productive cough. No dysuria. Some chills yesterday but not today. No sick contacts. Jennifer Neal says she feels fine today. She did have a fall last night when Jennifer legs "just gave away". No syncope reported. She does not have chest pain and is not short of breath. Jennifer weight is up about 6  Pounds. She is on low dose Lasix. No reports of difficulty with chewing or swallowing Jennifer food. Daughter also concerned that Jennifer mother has had no Procrit since December.   Current Outpatient Prescriptions on File Prior to Visit  Medication Sig Dispense Refill  . amLODipine (NORVASC) 5 MG tablet Take 0.5 tablets (2.5 mg total) by mouth daily.  30 tablet  5  . aspirin 81 MG EC tablet Take 81 mg by mouth daily.        . ferrous gluconate (FERGON) 324 MG tablet Take 324 mg by mouth daily with breakfast.        . lisinopril (PRINIVIL,ZESTRIL) 20 MG tablet TAKE 1 TABLET BY MOUTH TWICE A DAY  60 tablet  5  . spironolactone (ALDACTONE) 25 MG tablet Take 25 mg by mouth 4 (four) times daily.        Marland Kitchen DISCONTD: furosemide (LASIX) 20 MG tablet Take 20 mg by mouth daily.        Marland Kitchen Epoetin Alfa (PROCRIT IJ) Inject as directed once a week.         Current Facility-Administered Medications on File Prior to Visit  Medication Dose Route Frequency Provider Last Rate Last Dose  . epoetin alfa  (EPOGEN,PROCRIT) injection 20,000 Units  20,000 Units Subcutaneous Weekly Jay K. Allena Katz, MD        Allergies  Allergen Reactions  . Aspirin     Patient taking an aspirin daily  . Codeine   . Penicillins   . Sulfa Drugs Cross Reactors     Past Medical History  Diagnosis Date  . Hypertension   . Aortic stenosis, moderate     moderate to severe  . Rheumatoid arthritis   . Asthma   . Hepatic cirrhosis   . Anemia   . CKD (chronic kidney disease), stage III   . PNA (pneumonia) Dec 2012  . Diastolic CHF, chronic   . Abnormal echocardiogram Dec 2012    mild LVH, normal systolic function, EF 60 to 65%, grade 2 diastolic dysfunction, LAE, moderate AS and mild to moderate pulmonary HTN  . Resting tremor     Past Surgical History  Procedure Date  . Abdominal hysterectomy   . Breast lumpectomy     right    History  Smoking status  . Never Smoker   Smokeless tobacco  . Not on file    History  Alcohol Use No    Family History  Problem Relation Age of Onset  . Heart  attack Brother   . Cancer Brother   . Cancer Brother   . Aortic aneurysm Brother   . Cancer Sister   . Cancer Sister   . Cancer Sister   . Goiter Mother     Review of Systems: The review of systems is per the HPI.  All other systems were reviewed and are negative.  Physical Exam: BP 112/42  Pulse 66  Temp(Src) 98.9 F (37.2 C) (Oral)  Ht 5\' 3"  (1.6 m)  Wt 171 lb (77.565 kg)  BMI 30.29 kg/m2 Patient is an elderly female who looks chronically ill but in no acute distress.She does have a tremor. Skin is notably warm to touch and dry. Temp was only 98.8. Color is normal.  HEENT is unremarkable. Normocephalic/atraumatic. PERRL. Sclera are nonicteric. Neck is supple. No masses. No JVD. Lungs are clear. Cardiac exam shows a regular rate and rhythm. She has a harsh outlfow murmur.  Abdomen is obese but soft. Extremities are full with 1+ edema. No signs of cellulitis. Gait and ROM are intact but she requires  assistance.  No gross neurologic deficits noted.   LABORATORY DATA: PENDING   Assessment / Plan:

## 2011-02-14 NOTE — Patient Instructions (Signed)
Increase the Lasix to 40 mg each day for the next week.  Try to minimize your salt and elevate your legs as much as possible.  We are going to check labs and a chest xray today.  Monitor your temperature at home.  Call the [facility] office at [phone] if you have any questions, problems or concerns.

## 2011-02-15 ENCOUNTER — Telehealth: Payer: Self-pay | Admitting: Cardiology

## 2011-02-15 NOTE — Telephone Encounter (Signed)
Fu call Pt's daughter returning your call 

## 2011-02-16 NOTE — Telephone Encounter (Signed)
Spoke with patient's daughter [name] she stated patient has appointment at [facility] next week to receive procrit injection.Wants to know if we can get it sooner.Advised to call Dr.Patel's office since he was the Dr.who ordered procrit injection,and call short stay and see if pt can get on a cancellation list.

## 2011-02-21 ENCOUNTER — Encounter (HOSPITAL_COMMUNITY)
Admission: RE | Admit: 2011-02-21 | Discharge: 2011-02-21 | Disposition: A | Discharge status: Home / Self Care | Payer: Medicare HMO | Source: Ambulatory Visit | Attending: Nephrology | Admitting: Nephrology

## 2011-02-21 DIAGNOSIS — D638 Anemia in other chronic diseases classified elsewhere: Secondary | ICD-10-CM | POA: Insufficient documentation

## 2011-02-21 DIAGNOSIS — N183 Chronic kidney disease, stage 3 unspecified: Secondary | ICD-10-CM | POA: Insufficient documentation

## 2011-02-21 LAB — POCT HEMOGLOBIN-HEMACUE: Hemoglobin: 8.5 g/dL — ABNORMAL LOW (ref 12.0–15.0)

## 2011-02-21 LAB — FERRITIN: Ferritin: 470 ng/mL — ABNORMAL HIGH (ref 10–291)

## 2011-02-21 MED ORDER — EPOETIN ALFA 20000 UNIT/ML IJ SOLN
INTRAMUSCULAR | Status: AC
  Administered 2011-02-21: 20000 [IU] via SUBCUTANEOUS
  Filled 2011-02-21: qty 1

## 2011-02-21 MED ORDER — EPOETIN ALFA 10000 UNIT/ML IJ SOLN: 20000.0000 [IU] | INTRAMUSCULAR | Status: DC

## 2011-02-24 ENCOUNTER — Other Ambulatory Visit (HOSPITAL_COMMUNITY): Payer: Self-pay | Admitting: *Deleted

## 2011-02-28 ENCOUNTER — Encounter (HOSPITAL_COMMUNITY)
Admission: RE | Admit: 2011-02-28 | Discharge: 2011-02-28 | Disposition: A | Discharge status: Home / Self Care | Payer: Medicare HMO | Source: Ambulatory Visit | Attending: Nephrology | Admitting: Nephrology

## 2011-02-28 MED ORDER — EPOETIN ALFA 10000 UNIT/ML IJ SOLN: 20000.0000 [IU] | INTRAMUSCULAR | Status: DC

## 2011-02-28 MED ORDER — EPOETIN ALFA 20000 UNIT/ML IJ SOLN
INTRAMUSCULAR | Status: AC
  Administered 2011-02-28: 20000 [IU] via SUBCUTANEOUS
  Filled 2011-02-28: qty 1

## 2011-03-01 LAB — POCT HEMOGLOBIN-HEMACUE: Hemoglobin: 9 g/dL — ABNORMAL LOW (ref 12.0–15.0)

## 2011-03-07 ENCOUNTER — Encounter (HOSPITAL_COMMUNITY)
Admission: RE | Admit: 2011-03-07 | Discharge: 2011-03-07 | Disposition: A | Discharge status: Home / Self Care | Payer: Medicare HMO | Source: Ambulatory Visit | Attending: Nephrology | Admitting: Nephrology

## 2011-03-07 MED ORDER — EPOETIN ALFA 20000 UNIT/ML IJ SOLN
INTRAMUSCULAR | Status: AC
  Administered 2011-03-07: 20000 [IU] via SUBCUTANEOUS
  Filled 2011-03-07: qty 1

## 2011-03-07 MED ORDER — EPOETIN ALFA 10000 UNIT/ML IJ SOLN: 20000.0000 [IU] | INTRAMUSCULAR | Status: DC

## 2011-03-20 ENCOUNTER — Other Ambulatory Visit (HOSPITAL_COMMUNITY): Payer: Self-pay | Admitting: *Deleted

## 2011-03-21 ENCOUNTER — Encounter (HOSPITAL_COMMUNITY)
Admission: RE | Admit: 2011-03-21 | Discharge: 2011-03-21 | Disposition: A | Discharge status: Home / Self Care | Payer: Medicare HMO | Source: Ambulatory Visit | Attending: Nephrology | Admitting: Nephrology

## 2011-03-21 DIAGNOSIS — N183 Chronic kidney disease, stage 3 unspecified: Secondary | ICD-10-CM | POA: Insufficient documentation

## 2011-03-21 DIAGNOSIS — D638 Anemia in other chronic diseases classified elsewhere: Secondary | ICD-10-CM | POA: Insufficient documentation

## 2011-03-21 LAB — IRON AND TIBC
Iron: 89 ug/dL (ref 42–135)
TIBC: 186 ug/dL — ABNORMAL LOW (ref 250–470)

## 2011-03-21 LAB — BASIC METABOLIC PANEL
CO2: 27 mEq/L (ref 19–32)
Calcium: 9.1 mg/dL (ref 8.4–10.5)
GFR calc Af Amer: 36 mL/min — ABNORMAL LOW (ref >90)
GFR calc non Af Amer: 31 mL/min — ABNORMAL LOW (ref >90)
Sodium: 140 mEq/L (ref 135–145)

## 2011-03-21 LAB — POCT HEMOGLOBIN-HEMACUE: Hemoglobin: 9.7 g/dL — ABNORMAL LOW (ref 12.0–15.0)

## 2011-03-21 LAB — FERRITIN: Ferritin: 375 ng/mL — ABNORMAL HIGH (ref 10–291)

## 2011-03-21 MED ORDER — EPOETIN ALFA 20000 UNIT/ML IJ SOLN
INTRAMUSCULAR | Status: AC
  Administered 2011-03-21: 20000 [IU] via SUBCUTANEOUS
  Filled 2011-03-21: qty 1

## 2011-03-21 MED ORDER — EPOETIN ALFA 10000 UNIT/ML IJ SOLN: 20000.0000 [IU] | INTRAMUSCULAR | Status: DC

## 2011-03-31 ENCOUNTER — Encounter (HOSPITAL_COMMUNITY)
Admission: RE | Admit: 2011-03-31 | Discharge: 2011-03-31 | Disposition: A | Discharge status: Home / Self Care | Payer: Medicare HMO | Source: Ambulatory Visit | Attending: Nephrology | Admitting: Nephrology

## 2011-03-31 LAB — POCT HEMOGLOBIN-HEMACUE: Hemoglobin: 9.8 g/dL — ABNORMAL LOW (ref 12.0–15.0)

## 2011-03-31 MED ORDER — EPOETIN ALFA 10000 UNIT/ML IJ SOLN: 20000.0000 [IU] | INTRAMUSCULAR | Status: DC

## 2011-03-31 MED ORDER — EPOETIN ALFA 20000 UNIT/ML IJ SOLN
INTRAMUSCULAR | Status: AC
  Administered 2011-03-31: 20000 [IU]
  Filled 2011-03-31: qty 1

## 2011-04-06 ENCOUNTER — Other Ambulatory Visit (HOSPITAL_COMMUNITY): Payer: Self-pay | Admitting: *Deleted

## 2011-04-07 ENCOUNTER — Encounter (HOSPITAL_COMMUNITY)
Admission: RE | Admit: 2011-04-07 | Discharge: 2011-04-07 | Disposition: A | Discharge status: Home / Self Care | Payer: Medicare HMO | Source: Ambulatory Visit | Attending: Nephrology | Admitting: Nephrology

## 2011-04-07 MED ORDER — EPOETIN ALFA 10000 UNIT/ML IJ SOLN
20000.0000 [IU] | INTRAMUSCULAR | Status: DC
  Administered 2011-04-07: 20000 [IU] via SUBCUTANEOUS

## 2011-04-14 ENCOUNTER — Encounter (HOSPITAL_COMMUNITY)
Admission: RE | Admit: 2011-04-14 | Discharge: 2011-04-14 | Disposition: A | Discharge status: Home / Self Care | Payer: Medicare HMO | Source: Ambulatory Visit | Attending: Nephrology | Admitting: Nephrology

## 2011-04-14 DIAGNOSIS — N183 Chronic kidney disease, stage 3 unspecified: Secondary | ICD-10-CM | POA: Insufficient documentation

## 2011-04-14 DIAGNOSIS — D638 Anemia in other chronic diseases classified elsewhere: Secondary | ICD-10-CM | POA: Insufficient documentation

## 2011-04-14 MED ORDER — EPOETIN ALFA 10000 UNIT/ML IJ SOLN: 20000.0000 [IU] | INTRAMUSCULAR | Status: DC

## 2011-04-14 MED ORDER — EPOETIN ALFA 20000 UNIT/ML IJ SOLN
INTRAMUSCULAR | Status: AC
  Administered 2011-04-14: 20000 [IU]
  Filled 2011-04-14: qty 1

## 2011-04-21 ENCOUNTER — Encounter (HOSPITAL_COMMUNITY)
Admission: RE | Admit: 2011-04-21 | Discharge: 2011-04-21 | Disposition: A | Discharge status: Home / Self Care | Payer: Medicare HMO | Source: Ambulatory Visit | Attending: Nephrology | Admitting: Nephrology

## 2011-04-21 LAB — IRON AND TIBC
Iron: 77 ug/dL (ref 42–135)
TIBC: 154 ug/dL — ABNORMAL LOW (ref 250–470)
UIBC: 77 ug/dL — ABNORMAL LOW (ref 125–400)

## 2011-04-21 LAB — FERRITIN: Ferritin: 198 ng/mL (ref 10–291)

## 2011-04-21 MED ORDER — EPOETIN ALFA 10000 UNIT/ML IJ SOLN: 20000.0000 [IU] | INTRAMUSCULAR | Status: DC

## 2011-04-21 MED ORDER — EPOETIN ALFA 20000 UNIT/ML IJ SOLN
INTRAMUSCULAR | Status: AC
  Administered 2011-04-21: 20000 [IU] via SUBCUTANEOUS
  Filled 2011-04-21: qty 1

## 2011-04-28 ENCOUNTER — Encounter (HOSPITAL_COMMUNITY)
Admission: RE | Admit: 2011-04-28 | Discharge: 2011-04-28 | Disposition: A | Discharge status: Home / Self Care | Payer: Medicare HMO | Source: Ambulatory Visit | Attending: Nephrology | Admitting: Nephrology

## 2011-04-28 MED ORDER — EPOETIN ALFA 10000 UNIT/ML IJ SOLN: 20000.0000 [IU] | INTRAMUSCULAR | Status: DC

## 2011-05-12 ENCOUNTER — Encounter (HOSPITAL_COMMUNITY): Payer: Medicare HMO

## 2011-05-19 ENCOUNTER — Encounter (HOSPITAL_COMMUNITY)
Admission: RE | Admit: 2011-05-19 | Discharge: 2011-05-19 | Disposition: A | Discharge status: Home / Self Care | Payer: Medicare HMO | Source: Ambulatory Visit | Attending: Nephrology | Admitting: Nephrology

## 2011-05-19 DIAGNOSIS — N183 Chronic kidney disease, stage 3 unspecified: Secondary | ICD-10-CM | POA: Insufficient documentation

## 2011-05-19 DIAGNOSIS — D638 Anemia in other chronic diseases classified elsewhere: Secondary | ICD-10-CM | POA: Insufficient documentation

## 2011-05-19 LAB — FERRITIN: Ferritin: 539 ng/mL — ABNORMAL HIGH (ref 10–291)

## 2011-05-19 LAB — IRON AND TIBC: TIBC: 188 ug/dL — ABNORMAL LOW (ref 250–470)

## 2011-05-19 LAB — POCT HEMOGLOBIN-HEMACUE: Hemoglobin: 10.2 g/dL — ABNORMAL LOW (ref 12.0–15.0)

## 2011-05-19 MED ORDER — EPOETIN ALFA 20000 UNIT/ML IJ SOLN
INTRAMUSCULAR | Status: AC
  Administered 2011-05-19: 20000 [IU]
  Filled 2011-05-19: qty 1

## 2011-05-19 MED ORDER — EPOETIN ALFA 10000 UNIT/ML IJ SOLN: 20000.0000 [IU] | INTRAMUSCULAR | Status: DC

## 2011-05-26 ENCOUNTER — Encounter (HOSPITAL_COMMUNITY)
Admission: RE | Admit: 2011-05-26 | Discharge: 2011-05-26 | Disposition: A | Discharge status: Home / Self Care | Payer: Medicare HMO | Source: Ambulatory Visit | Attending: Nephrology | Admitting: Nephrology

## 2011-05-26 MED ORDER — EPOETIN ALFA 20000 UNIT/ML IJ SOLN
INTRAMUSCULAR | Status: AC
  Administered 2011-05-26: 20000 [IU] via SUBCUTANEOUS
  Filled 2011-05-26: qty 1

## 2011-05-26 MED ORDER — EPOETIN ALFA 10000 UNIT/ML IJ SOLN: 20000.0000 [IU] | INTRAMUSCULAR | Status: DC

## 2011-06-02 ENCOUNTER — Encounter (HOSPITAL_COMMUNITY)
Admission: RE | Admit: 2011-06-02 | Discharge: 2011-06-02 | Disposition: A | Discharge status: Home / Self Care | Payer: Medicare HMO | Source: Ambulatory Visit | Attending: Nephrology | Admitting: Nephrology

## 2011-06-02 LAB — POCT HEMOGLOBIN-HEMACUE: Hemoglobin: 10.2 g/dL — ABNORMAL LOW (ref 12.0–15.0)

## 2011-06-02 MED ORDER — EPOETIN ALFA 20000 UNIT/ML IJ SOLN
INTRAMUSCULAR | Status: AC
  Administered 2011-06-02: 20000 [IU]
  Filled 2011-06-02: qty 1

## 2011-06-02 MED ORDER — EPOETIN ALFA 10000 UNIT/ML IJ SOLN: 20000.0000 [IU] | INTRAMUSCULAR | Status: DC

## 2011-06-12 ENCOUNTER — Other Ambulatory Visit: Payer: Self-pay | Admitting: Cardiology

## 2011-06-12 ENCOUNTER — Encounter (HOSPITAL_COMMUNITY)
Admission: RE | Admit: 2011-06-12 | Discharge: 2011-06-12 | Disposition: A | Discharge status: Home / Self Care | Payer: Medicare HMO | Source: Ambulatory Visit | Attending: Nephrology | Admitting: Nephrology

## 2011-06-12 DIAGNOSIS — N183 Chronic kidney disease, stage 3 unspecified: Secondary | ICD-10-CM | POA: Insufficient documentation

## 2011-06-12 DIAGNOSIS — D638 Anemia in other chronic diseases classified elsewhere: Secondary | ICD-10-CM | POA: Insufficient documentation

## 2011-06-12 MED ORDER — LISINOPRIL 20 MG PO TABS: 20.0000 mg | ORAL_TABLET | Freq: Two times a day (BID) | ORAL | Status: DC

## 2011-06-12 MED ORDER — EPOETIN ALFA 10000 UNIT/ML IJ SOLN: 20000.0000 [IU] | INTRAMUSCULAR | Status: DC

## 2011-06-12 MED ORDER — EPOETIN ALFA 20000 UNIT/ML IJ SOLN
INTRAMUSCULAR | Status: AC
  Administered 2011-06-12: 20000 [IU] via SUBCUTANEOUS
  Filled 2011-06-12: qty 1

## 2011-06-13 LAB — IRON AND TIBC
Iron: 95 ug/dL (ref 42–135)
Saturation Ratios: 52 % (ref 20–55)
TIBC: 184 ug/dL — ABNORMAL LOW (ref 250–470)

## 2011-06-23 ENCOUNTER — Encounter (HOSPITAL_COMMUNITY)
Admission: RE | Admit: 2011-06-23 | Discharge: 2011-06-23 | Disposition: A | Discharge status: Home / Self Care | Payer: Medicare HMO | Source: Ambulatory Visit | Attending: Nephrology | Admitting: Nephrology

## 2011-06-23 MED ORDER — EPOETIN ALFA 20000 UNIT/ML IJ SOLN
INTRAMUSCULAR | Status: AC
  Administered 2011-06-23: 20000 [IU] via SUBCUTANEOUS
  Filled 2011-06-23: qty 1

## 2011-06-23 MED ORDER — EPOETIN ALFA 10000 UNIT/ML IJ SOLN: 20000.0000 [IU] | INTRAMUSCULAR | Status: DC

## 2011-06-29 ENCOUNTER — Other Ambulatory Visit (HOSPITAL_COMMUNITY): Payer: Self-pay | Admitting: *Deleted

## 2011-06-30 ENCOUNTER — Encounter (HOSPITAL_COMMUNITY)
Admission: RE | Admit: 2011-06-30 | Discharge: 2011-06-30 | Disposition: A | Discharge status: Home / Self Care | Payer: Medicare HMO | Source: Ambulatory Visit | Attending: Nephrology | Admitting: Nephrology

## 2011-06-30 MED ORDER — EPOETIN ALFA 20000 UNIT/ML IJ SOLN
INTRAMUSCULAR | Status: AC
  Administered 2011-06-30: 20000 [IU] via SUBCUTANEOUS
  Filled 2011-06-30: qty 1

## 2011-06-30 MED ORDER — EPOETIN ALFA 10000 UNIT/ML IJ SOLN: 20000.0000 [IU] | INTRAMUSCULAR | Status: DC

## 2011-07-07 ENCOUNTER — Encounter (HOSPITAL_COMMUNITY)
Admission: RE | Admit: 2011-07-07 | Discharge: 2011-07-07 | Disposition: A | Discharge status: Home / Self Care | Payer: Medicare HMO | Source: Ambulatory Visit | Attending: Nephrology | Admitting: Nephrology

## 2011-07-07 LAB — POCT HEMOGLOBIN-HEMACUE: Hemoglobin: 10.4 g/dL — ABNORMAL LOW (ref 12.0–15.0)

## 2011-07-07 MED ORDER — EPOETIN ALFA 20000 UNIT/ML IJ SOLN
INTRAMUSCULAR | Status: AC
  Administered 2011-07-07: 15:00:00 via SUBCUTANEOUS
  Filled 2011-07-07: qty 1

## 2011-07-07 MED ORDER — EPOETIN ALFA 10000 UNIT/ML IJ SOLN: 20000.0000 [IU] | INTRAMUSCULAR | Status: DC

## 2011-07-14 ENCOUNTER — Encounter (HOSPITAL_COMMUNITY)
Admission: RE | Admit: 2011-07-14 | Discharge: 2011-07-14 | Disposition: A | Discharge status: Home / Self Care | Payer: Medicare HMO | Source: Ambulatory Visit | Attending: Nephrology | Admitting: Nephrology

## 2011-07-14 DIAGNOSIS — D638 Anemia in other chronic diseases classified elsewhere: Secondary | ICD-10-CM | POA: Insufficient documentation

## 2011-07-14 DIAGNOSIS — N183 Chronic kidney disease, stage 3 unspecified: Secondary | ICD-10-CM | POA: Insufficient documentation

## 2011-07-14 LAB — POCT HEMOGLOBIN-HEMACUE: Hemoglobin: 10.8 g/dL — ABNORMAL LOW (ref 12.0–15.0)

## 2011-07-14 LAB — IRON AND TIBC
Saturation Ratios: 29 % (ref 20–55)
UIBC: 131 ug/dL (ref 125–400)

## 2011-07-14 MED ORDER — EPOETIN ALFA 20000 UNIT/ML IJ SOLN
INTRAMUSCULAR | Status: AC
  Administered 2011-07-14: 20000 [IU] via SUBCUTANEOUS
  Filled 2011-07-14: qty 1

## 2011-07-14 MED ORDER — EPOETIN ALFA 10000 UNIT/ML IJ SOLN: 20000.0000 [IU] | INTRAMUSCULAR | Status: DC

## 2011-07-21 ENCOUNTER — Encounter (HOSPITAL_COMMUNITY)
Admission: RE | Admit: 2011-07-21 | Discharge: 2011-07-21 | Disposition: A | Discharge status: Home / Self Care | Payer: Medicare HMO | Source: Ambulatory Visit | Attending: Nephrology | Admitting: Nephrology

## 2011-07-21 LAB — POCT HEMOGLOBIN-HEMACUE: Hemoglobin: 10.6 g/dL — ABNORMAL LOW (ref 12.0–15.0)

## 2011-07-21 MED ORDER — EPOETIN ALFA 20000 UNIT/ML IJ SOLN
INTRAMUSCULAR | Status: AC
  Administered 2011-07-21: 20000 [IU] via SUBCUTANEOUS
  Filled 2011-07-21: qty 1

## 2011-07-21 MED ORDER — EPOETIN ALFA 10000 UNIT/ML IJ SOLN
20000.0000 [IU] | INTRAMUSCULAR | Status: DC
Start: 2011-07-21 — End: 2011-07-22

## 2011-07-28 ENCOUNTER — Encounter (HOSPITAL_COMMUNITY)
Admission: RE | Admit: 2011-07-28 | Discharge: 2011-07-28 | Disposition: A | Discharge status: Home / Self Care | Payer: Medicare HMO | Source: Ambulatory Visit | Attending: Nephrology | Admitting: Nephrology

## 2011-07-28 MED ORDER — EPOETIN ALFA 20000 UNIT/ML IJ SOLN
INTRAMUSCULAR | Status: AC
  Filled 2011-07-28: qty 1

## 2011-07-28 MED ORDER — EPOETIN ALFA 20000 UNIT/ML IJ SOLN
INTRAMUSCULAR | Status: AC
  Administered 2011-07-28: 20000 [IU] via SUBCUTANEOUS
  Filled 2011-07-28: qty 1

## 2011-07-28 MED ORDER — EPOETIN ALFA 10000 UNIT/ML IJ SOLN: 20000.0000 [IU] | INTRAMUSCULAR | Status: DC

## 2011-08-04 ENCOUNTER — Encounter (HOSPITAL_COMMUNITY)
Admission: RE | Admit: 2011-08-04 | Discharge: 2011-08-04 | Disposition: A | Discharge status: Home / Self Care | Payer: Medicare HMO | Source: Ambulatory Visit | Attending: Nephrology | Admitting: Nephrology

## 2011-08-04 MED ORDER — EPOETIN ALFA 10000 UNIT/ML IJ SOLN: 20000.0000 [IU] | INTRAMUSCULAR | Status: DC

## 2011-08-04 MED ORDER — EPOETIN ALFA 20000 UNIT/ML IJ SOLN
INTRAMUSCULAR | Status: AC
  Administered 2011-08-04: 20000 [IU] via SUBCUTANEOUS
  Filled 2011-08-04: qty 1

## 2011-08-11 ENCOUNTER — Encounter (HOSPITAL_COMMUNITY)
Admission: RE | Admit: 2011-08-11 | Discharge: 2011-08-11 | Disposition: A | Discharge status: Home / Self Care | Payer: Medicare HMO | Source: Ambulatory Visit | Attending: Nephrology | Admitting: Nephrology

## 2011-08-11 DIAGNOSIS — D638 Anemia in other chronic diseases classified elsewhere: Secondary | ICD-10-CM | POA: Insufficient documentation

## 2011-08-11 DIAGNOSIS — N183 Chronic kidney disease, stage 3 unspecified: Secondary | ICD-10-CM | POA: Insufficient documentation

## 2011-08-11 LAB — FERRITIN: Ferritin: 197 ng/mL (ref 10–291)

## 2011-08-11 LAB — IRON AND TIBC
Iron: 65 ug/dL (ref 42–135)
TIBC: 157 ug/dL — ABNORMAL LOW (ref 250–470)
UIBC: 92 ug/dL — ABNORMAL LOW (ref 125–400)

## 2011-08-11 LAB — POCT HEMOGLOBIN-HEMACUE: Hemoglobin: 12.8 g/dL (ref 12.0–15.0)

## 2011-08-11 MED ORDER — EPOETIN ALFA 10000 UNIT/ML IJ SOLN: 20000.0000 [IU] | INTRAMUSCULAR | Status: DC

## 2011-08-25 ENCOUNTER — Encounter (HOSPITAL_COMMUNITY)
Admission: RE | Admit: 2011-08-25 | Discharge: 2011-08-25 | Disposition: A | Discharge status: Home / Self Care | Payer: Medicare HMO | Source: Ambulatory Visit | Attending: Nephrology | Admitting: Nephrology

## 2011-08-25 MED ORDER — EPOETIN ALFA 10000 UNIT/ML IJ SOLN: 20000.0000 [IU] | INTRAMUSCULAR | Status: DC

## 2011-08-25 MED ORDER — EPOETIN ALFA 20000 UNIT/ML IJ SOLN
INTRAMUSCULAR | Status: AC
  Administered 2011-08-25: 20000 [IU] via SUBCUTANEOUS
  Filled 2011-08-25: qty 1

## 2011-09-01 ENCOUNTER — Encounter (HOSPITAL_COMMUNITY)
Admission: RE | Admit: 2011-09-01 | Discharge: 2011-09-01 | Disposition: A | Discharge status: Home / Self Care | Payer: Medicare HMO | Source: Ambulatory Visit | Attending: Nephrology | Admitting: Nephrology

## 2011-09-01 LAB — POCT HEMOGLOBIN-HEMACUE: Hemoglobin: 10.2 g/dL — ABNORMAL LOW (ref 12.0–15.0)

## 2011-09-01 MED ORDER — EPOETIN ALFA 10000 UNIT/ML IJ SOLN: 20000.0000 [IU] | INTRAMUSCULAR | Status: DC

## 2011-09-01 MED ORDER — EPOETIN ALFA 20000 UNIT/ML IJ SOLN
INTRAMUSCULAR | Status: AC
  Administered 2011-09-01: 20000 [IU] via SUBCUTANEOUS
  Filled 2011-09-01: qty 1

## 2011-09-08 ENCOUNTER — Encounter (HOSPITAL_COMMUNITY)
Admission: RE | Admit: 2011-09-08 | Discharge: 2011-09-08 | Disposition: A | Discharge status: Home / Self Care | Payer: Medicare HMO | Source: Ambulatory Visit | Attending: Nephrology | Admitting: Nephrology

## 2011-09-08 LAB — IRON AND TIBC
Iron: 137 ug/dL — ABNORMAL HIGH (ref 42–135)
TIBC: 178 ug/dL — ABNORMAL LOW (ref 250–470)
UIBC: 41 ug/dL — ABNORMAL LOW (ref 125–400)

## 2011-09-08 LAB — FERRITIN: Ferritin: 289 ng/mL (ref 10–291)

## 2011-09-08 MED ORDER — EPOETIN ALFA 10000 UNIT/ML IJ SOLN: 20000.0000 [IU] | INTRAMUSCULAR | Status: DC

## 2011-09-08 MED ORDER — EPOETIN ALFA 20000 UNIT/ML IJ SOLN
INTRAMUSCULAR | Status: AC
  Administered 2011-09-08: 20000 [IU] via SUBCUTANEOUS
  Filled 2011-09-08: qty 1

## 2011-09-15 ENCOUNTER — Encounter (HOSPITAL_COMMUNITY)
Admission: RE | Admit: 2011-09-15 | Discharge: 2011-09-15 | Disposition: A | Discharge status: Home / Self Care | Payer: Medicare HMO | Source: Ambulatory Visit | Attending: Nephrology | Admitting: Nephrology

## 2011-09-15 DIAGNOSIS — N183 Chronic kidney disease, stage 3 unspecified: Secondary | ICD-10-CM | POA: Insufficient documentation

## 2011-09-15 DIAGNOSIS — D638 Anemia in other chronic diseases classified elsewhere: Secondary | ICD-10-CM | POA: Insufficient documentation

## 2011-09-15 MED ORDER — EPOETIN ALFA 10000 UNIT/ML IJ SOLN: 20000.0000 [IU] | INTRAMUSCULAR | Status: DC

## 2011-09-15 MED ORDER — EPOETIN ALFA 20000 UNIT/ML IJ SOLN
INTRAMUSCULAR | Status: AC
  Administered 2011-09-15: 20000 [IU] via SUBCUTANEOUS
  Filled 2011-09-15: qty 1

## 2011-09-22 ENCOUNTER — Encounter (HOSPITAL_COMMUNITY)
Admission: RE | Admit: 2011-09-22 | Discharge: 2011-09-22 | Disposition: A | Discharge status: Home / Self Care | Payer: Medicare HMO | Source: Ambulatory Visit | Attending: Nephrology | Admitting: Nephrology

## 2011-09-22 MED ORDER — EPOETIN ALFA 20000 UNIT/ML IJ SOLN
INTRAMUSCULAR | Status: AC
  Administered 2011-09-22: 20000 [IU]
  Filled 2011-09-22: qty 1

## 2011-09-22 MED ORDER — EPOETIN ALFA 10000 UNIT/ML IJ SOLN: 20000.0000 [IU] | INTRAMUSCULAR | Status: DC

## 2011-09-28 ENCOUNTER — Other Ambulatory Visit (HOSPITAL_COMMUNITY): Payer: Self-pay | Admitting: *Deleted

## 2011-09-29 ENCOUNTER — Encounter (HOSPITAL_COMMUNITY)
Admission: RE | Admit: 2011-09-29 | Discharge: 2011-09-29 | Disposition: A | Discharge status: Home / Self Care | Payer: Medicare HMO | Source: Ambulatory Visit | Attending: Nephrology | Admitting: Nephrology

## 2011-09-29 MED ORDER — EPOETIN ALFA 10000 UNIT/ML IJ SOLN: 20000.0000 [IU] | INTRAMUSCULAR | Status: DC

## 2011-09-29 MED ORDER — EPOETIN ALFA 20000 UNIT/ML IJ SOLN
INTRAMUSCULAR | Status: AC
  Administered 2011-09-29: 20000 [IU] via SUBCUTANEOUS
  Filled 2011-09-29: qty 1

## 2011-10-04 ENCOUNTER — Other Ambulatory Visit (HOSPITAL_COMMUNITY): Payer: Self-pay | Admitting: *Deleted

## 2011-10-06 ENCOUNTER — Encounter (HOSPITAL_COMMUNITY)
Admission: RE | Admit: 2011-10-06 | Discharge: 2011-10-06 | Disposition: A | Discharge status: Home / Self Care | Payer: Medicare HMO | Source: Ambulatory Visit | Attending: Nephrology | Admitting: Nephrology

## 2011-10-06 LAB — IRON AND TIBC
Saturation Ratios: 72 % — ABNORMAL HIGH (ref 20–55)
UIBC: 49 ug/dL — ABNORMAL LOW (ref 125–400)

## 2011-10-06 LAB — FERRITIN: Ferritin: 203 ng/mL (ref 10–291)

## 2011-10-06 LAB — POCT HEMOGLOBIN-HEMACUE: Hemoglobin: 10.9 g/dL — ABNORMAL LOW (ref 12.0–15.0)

## 2011-10-06 MED ORDER — EPOETIN ALFA 10000 UNIT/ML IJ SOLN: 20000.0000 [IU] | INTRAMUSCULAR | Status: DC

## 2011-10-06 MED ORDER — EPOETIN ALFA 20000 UNIT/ML IJ SOLN
INTRAMUSCULAR | Status: AC
  Administered 2011-10-06: 20000 [IU] via SUBCUTANEOUS
  Filled 2011-10-06: qty 1

## 2011-10-13 ENCOUNTER — Encounter (HOSPITAL_COMMUNITY)
Admission: RE | Admit: 2011-10-13 | Discharge: 2011-10-13 | Disposition: A | Discharge status: Home / Self Care | Payer: Medicare HMO | Source: Ambulatory Visit | Attending: Nephrology | Admitting: Nephrology

## 2011-10-13 DIAGNOSIS — N183 Chronic kidney disease, stage 3 unspecified: Secondary | ICD-10-CM | POA: Insufficient documentation

## 2011-10-13 DIAGNOSIS — D638 Anemia in other chronic diseases classified elsewhere: Secondary | ICD-10-CM | POA: Insufficient documentation

## 2011-10-13 MED ORDER — EPOETIN ALFA 20000 UNIT/ML IJ SOLN
INTRAMUSCULAR | Status: AC
  Administered 2011-10-13: 20000 [IU]
  Filled 2011-10-13: qty 1

## 2011-10-13 MED ORDER — EPOETIN ALFA 10000 UNIT/ML IJ SOLN: 20000.0000 [IU] | INTRAMUSCULAR | Status: DC

## 2011-10-16 ENCOUNTER — Other Ambulatory Visit: Payer: Self-pay | Admitting: *Deleted

## 2011-10-20 ENCOUNTER — Encounter (HOSPITAL_COMMUNITY): Payer: Medicare HMO

## 2011-10-20 ENCOUNTER — Other Ambulatory Visit: Payer: Self-pay | Admitting: *Deleted

## 2011-10-20 MED ORDER — FUROSEMIDE 20 MG PO TABS: 40.0000 mg | ORAL_TABLET | Freq: Every day | ORAL | Status: DC

## 2011-10-27 ENCOUNTER — Encounter (HOSPITAL_COMMUNITY)
Admission: RE | Admit: 2011-10-27 | Discharge: 2011-10-27 | Disposition: A | Discharge status: Home / Self Care | Payer: Medicare HMO | Source: Ambulatory Visit | Attending: Nephrology | Admitting: Nephrology

## 2011-10-27 DIAGNOSIS — N183 Chronic kidney disease, stage 3 unspecified: Secondary | ICD-10-CM | POA: Insufficient documentation

## 2011-10-27 DIAGNOSIS — D638 Anemia in other chronic diseases classified elsewhere: Secondary | ICD-10-CM | POA: Insufficient documentation

## 2011-10-27 LAB — POCT HEMOGLOBIN-HEMACUE: Hemoglobin: 10.2 g/dL — ABNORMAL LOW (ref 12.0–15.0)

## 2011-10-27 MED ORDER — EPOETIN ALFA 20000 UNIT/ML IJ SOLN
INTRAMUSCULAR | Status: AC
  Administered 2011-10-27: 20000 [IU] via SUBCUTANEOUS
  Filled 2011-10-27: qty 1

## 2011-10-27 MED ORDER — EPOETIN ALFA 10000 UNIT/ML IJ SOLN: 20000.0000 [IU] | INTRAMUSCULAR | Status: DC

## 2011-11-03 ENCOUNTER — Encounter (HOSPITAL_COMMUNITY)
Admission: RE | Admit: 2011-11-03 | Discharge: 2011-11-03 | Disposition: A | Discharge status: Home / Self Care | Payer: Medicare HMO | Source: Ambulatory Visit | Attending: Nephrology | Admitting: Nephrology

## 2011-11-03 LAB — IRON AND TIBC
Iron: 68 ug/dL (ref 42–135)
Saturation Ratios: 37 % (ref 20–55)
TIBC: 184 ug/dL — ABNORMAL LOW (ref 250–470)
UIBC: 116 ug/dL — ABNORMAL LOW (ref 125–400)

## 2011-11-03 MED ORDER — EPOETIN ALFA 10000 UNIT/ML IJ SOLN: 20000.0000 [IU] | INTRAMUSCULAR | Status: DC

## 2011-11-03 MED ORDER — EPOETIN ALFA 20000 UNIT/ML IJ SOLN
INTRAMUSCULAR | Status: AC
  Administered 2011-11-03: 20000 [IU] via SUBCUTANEOUS
  Filled 2011-11-03: qty 1

## 2011-11-10 ENCOUNTER — Encounter (HOSPITAL_COMMUNITY)
Admission: RE | Admit: 2011-11-10 | Discharge: 2011-11-10 | Disposition: A | Discharge status: Home / Self Care | Payer: Medicare HMO | Source: Ambulatory Visit | Attending: Nephrology | Admitting: Nephrology

## 2011-11-10 DIAGNOSIS — D638 Anemia in other chronic diseases classified elsewhere: Secondary | ICD-10-CM | POA: Insufficient documentation

## 2011-11-10 DIAGNOSIS — N183 Chronic kidney disease, stage 3 unspecified: Secondary | ICD-10-CM | POA: Insufficient documentation

## 2011-11-10 MED ORDER — EPOETIN ALFA 20000 UNIT/ML IJ SOLN
INTRAMUSCULAR | Status: AC
  Administered 2011-11-10: 20000 [IU] via SUBCUTANEOUS
  Filled 2011-11-10: qty 1

## 2011-11-10 MED ORDER — EPOETIN ALFA 10000 UNIT/ML IJ SOLN: 20000.0000 [IU] | INTRAMUSCULAR | Status: DC

## 2011-11-13 ENCOUNTER — Other Ambulatory Visit: Payer: Self-pay | Admitting: Cardiology

## 2011-11-17 ENCOUNTER — Encounter (HOSPITAL_COMMUNITY)
Admission: RE | Admit: 2011-11-17 | Discharge: 2011-11-17 | Disposition: A | Discharge status: Home / Self Care | Payer: Medicare HMO | Source: Ambulatory Visit | Attending: Nephrology | Admitting: Nephrology

## 2011-11-17 MED ORDER — EPOETIN ALFA 20000 UNIT/ML IJ SOLN
INTRAMUSCULAR | Status: AC
  Administered 2011-11-17: 20000 [IU] via SUBCUTANEOUS
  Filled 2011-11-17: qty 1

## 2011-11-17 MED ORDER — EPOETIN ALFA 10000 UNIT/ML IJ SOLN: 20000.0000 [IU] | INTRAMUSCULAR | Status: DC

## 2011-11-24 ENCOUNTER — Encounter (HOSPITAL_COMMUNITY)
Admission: RE | Admit: 2011-11-24 | Discharge: 2011-11-24 | Disposition: A | Discharge status: Home / Self Care | Payer: Medicare HMO | Source: Ambulatory Visit | Attending: Nephrology | Admitting: Nephrology

## 2011-11-24 DIAGNOSIS — N183 Chronic kidney disease, stage 3 unspecified: Secondary | ICD-10-CM | POA: Insufficient documentation

## 2011-11-24 DIAGNOSIS — D638 Anemia in other chronic diseases classified elsewhere: Secondary | ICD-10-CM | POA: Insufficient documentation

## 2011-11-24 MED ORDER — EPOETIN ALFA 10000 UNIT/ML IJ SOLN: 20000.0000 [IU] | INTRAMUSCULAR | Status: DC

## 2011-11-24 MED ORDER — EPOETIN ALFA 20000 UNIT/ML IJ SOLN
INTRAMUSCULAR | Status: AC
  Administered 2011-11-24: 20000 [IU] via SUBCUTANEOUS
  Filled 2011-11-24: qty 1

## 2011-12-01 ENCOUNTER — Encounter (HOSPITAL_COMMUNITY)
Admission: RE | Admit: 2011-12-01 | Discharge: 2011-12-01 | Disposition: A | Discharge status: Home / Self Care | Payer: Medicare HMO | Source: Ambulatory Visit | Attending: Nephrology | Admitting: Nephrology

## 2011-12-01 LAB — FERRITIN: Ferritin: 188 ng/mL (ref 10–291)

## 2011-12-01 LAB — POCT HEMOGLOBIN-HEMACUE: Hemoglobin: 10.2 g/dL — ABNORMAL LOW (ref 12.0–15.0)

## 2011-12-01 MED ORDER — EPOETIN ALFA 10000 UNIT/ML IJ SOLN: 20000.0000 [IU] | INTRAMUSCULAR | Status: DC

## 2011-12-01 MED ORDER — EPOETIN ALFA 20000 UNIT/ML IJ SOLN
INTRAMUSCULAR | Status: AC
  Administered 2011-12-01: 20000 [IU] via SUBCUTANEOUS
  Filled 2011-12-01: qty 1

## 2011-12-08 ENCOUNTER — Encounter (HOSPITAL_COMMUNITY)
Admission: RE | Admit: 2011-12-08 | Discharge: 2011-12-08 | Disposition: A | Discharge status: Home / Self Care | Payer: Medicare HMO | Source: Ambulatory Visit | Attending: Nephrology | Admitting: Nephrology

## 2011-12-08 LAB — POCT HEMOGLOBIN-HEMACUE: Hemoglobin: 10.5 g/dL — ABNORMAL LOW (ref 12.0–15.0)

## 2011-12-08 MED ORDER — EPOETIN ALFA 20000 UNIT/ML IJ SOLN
INTRAMUSCULAR | Status: AC
  Administered 2011-12-08: 20000 [IU] via SUBCUTANEOUS
  Filled 2011-12-08: qty 1

## 2011-12-08 MED ORDER — EPOETIN ALFA 10000 UNIT/ML IJ SOLN: 20000.0000 [IU] | INTRAMUSCULAR | Status: DC

## 2011-12-15 ENCOUNTER — Encounter (HOSPITAL_COMMUNITY)
Admission: RE | Admit: 2011-12-15 | Discharge: 2011-12-15 | Disposition: A | Discharge status: Home / Self Care | Payer: Medicare HMO | Source: Ambulatory Visit | Attending: Nephrology | Admitting: Nephrology

## 2011-12-15 DIAGNOSIS — D638 Anemia in other chronic diseases classified elsewhere: Secondary | ICD-10-CM | POA: Insufficient documentation

## 2011-12-15 DIAGNOSIS — N183 Chronic kidney disease, stage 3 unspecified: Secondary | ICD-10-CM | POA: Insufficient documentation

## 2011-12-15 MED ORDER — EPOETIN ALFA 20000 UNIT/ML IJ SOLN
INTRAMUSCULAR | Status: AC
  Administered 2011-12-15: 20000 [IU] via SUBCUTANEOUS
  Filled 2011-12-15: qty 1

## 2011-12-15 MED ORDER — EPOETIN ALFA 10000 UNIT/ML IJ SOLN: 20000.0000 [IU] | INTRAMUSCULAR | Status: DC

## 2011-12-29 ENCOUNTER — Encounter (HOSPITAL_COMMUNITY)
Admission: RE | Admit: 2011-12-29 | Discharge: 2011-12-29 | Disposition: A | Discharge status: Home / Self Care | Payer: Medicare HMO | Source: Ambulatory Visit | Attending: Nephrology | Admitting: Nephrology

## 2011-12-29 LAB — IRON AND TIBC
Iron: 126 ug/dL (ref 42–135)
Saturation Ratios: 79 % — ABNORMAL HIGH (ref 20–55)
TIBC: 160 ug/dL — ABNORMAL LOW (ref 250–470)
UIBC: 34 ug/dL — ABNORMAL LOW (ref 125–400)

## 2011-12-29 LAB — FERRITIN: Ferritin: 244 ng/mL (ref 10–291)

## 2011-12-29 LAB — POCT HEMOGLOBIN-HEMACUE: Hemoglobin: 9.7 g/dL — ABNORMAL LOW (ref 12.0–15.0)

## 2011-12-29 MED ORDER — EPOETIN ALFA 10000 UNIT/ML IJ SOLN: 20000.0000 [IU] | INTRAMUSCULAR | Status: DC

## 2011-12-29 MED ORDER — EPOETIN ALFA 20000 UNIT/ML IJ SOLN
INTRAMUSCULAR | Status: AC
  Administered 2011-12-29: 20000 [IU] via SUBCUTANEOUS
  Filled 2011-12-29: qty 1

## 2012-01-01 ENCOUNTER — Telehealth: Payer: Self-pay | Admitting: *Deleted

## 2012-01-01 MED ORDER — FUROSEMIDE 20 MG PO TABS: 40.0000 mg | ORAL_TABLET | Freq: Every day | ORAL | Status: DC

## 2012-01-01 MED ORDER — AMLODIPINE BESYLATE 5 MG PO TABS: 2.5000 mg | ORAL_TABLET | Freq: Every day | ORAL | Status: DC

## 2012-01-01 NOTE — Telephone Encounter (Signed)
rx refill

## 2012-01-05 ENCOUNTER — Encounter (HOSPITAL_COMMUNITY)
Admission: RE | Admit: 2012-01-05 | Discharge: 2012-01-05 | Disposition: A | Discharge status: Home / Self Care | Payer: Medicare HMO | Source: Ambulatory Visit | Attending: Nephrology | Admitting: Nephrology

## 2012-01-05 MED ORDER — EPOETIN ALFA 20000 UNIT/ML IJ SOLN
INTRAMUSCULAR | Status: AC
  Administered 2012-01-05: 20000 [IU] via SUBCUTANEOUS
  Filled 2012-01-05: qty 1

## 2012-01-05 MED ORDER — EPOETIN ALFA 10000 UNIT/ML IJ SOLN: 20000.0000 [IU] | INTRAMUSCULAR | Status: DC

## 2012-01-15 ENCOUNTER — Encounter (HOSPITAL_COMMUNITY)
Admission: RE | Admit: 2012-01-15 | Discharge: 2012-01-15 | Disposition: A | Discharge status: Home / Self Care | Payer: Medicare HMO | Source: Ambulatory Visit | Attending: Nephrology | Admitting: Nephrology

## 2012-01-15 DIAGNOSIS — N183 Chronic kidney disease, stage 3 unspecified: Secondary | ICD-10-CM | POA: Insufficient documentation

## 2012-01-15 DIAGNOSIS — D638 Anemia in other chronic diseases classified elsewhere: Secondary | ICD-10-CM | POA: Insufficient documentation

## 2012-01-15 LAB — POCT HEMOGLOBIN-HEMACUE: Hemoglobin: 9.1 g/dL — ABNORMAL LOW (ref 12.0–15.0)

## 2012-01-15 MED ORDER — EPOETIN ALFA 10000 UNIT/ML IJ SOLN: 20000.0000 [IU] | INTRAMUSCULAR | Status: DC

## 2012-01-15 MED ORDER — EPOETIN ALFA 20000 UNIT/ML IJ SOLN
INTRAMUSCULAR | Status: AC
  Administered 2012-01-15: 20000 [IU] via SUBCUTANEOUS
  Filled 2012-01-15: qty 1

## 2012-01-25 ENCOUNTER — Encounter (HOSPITAL_COMMUNITY): Payer: Medicare HMO

## 2012-01-26 ENCOUNTER — Encounter (HOSPITAL_COMMUNITY): Payer: Medicare HMO

## 2012-02-01 ENCOUNTER — Encounter (HOSPITAL_COMMUNITY)
Admission: RE | Admit: 2012-02-01 | Discharge: 2012-02-01 | Disposition: A | Discharge status: Home / Self Care | Payer: Medicare HMO | Source: Ambulatory Visit | Attending: Nephrology | Admitting: Nephrology

## 2012-02-01 ENCOUNTER — Encounter (HOSPITAL_COMMUNITY): Payer: Medicare HMO

## 2012-02-01 LAB — IRON AND TIBC
Saturation Ratios: 33 % (ref 20–55)
UIBC: 105 ug/dL — ABNORMAL LOW (ref 125–400)

## 2012-02-01 MED ORDER — EPOETIN ALFA 20000 UNIT/ML IJ SOLN
INTRAMUSCULAR | Status: AC
  Administered 2012-02-01: 20000 [IU] via SUBCUTANEOUS
  Filled 2012-02-01: qty 1

## 2012-02-01 MED ORDER — EPOETIN ALFA 10000 UNIT/ML IJ SOLN: 20000.0000 [IU] | INTRAMUSCULAR | Status: DC

## 2012-02-08 ENCOUNTER — Encounter (HOSPITAL_COMMUNITY): Payer: Medicare HMO

## 2012-02-15 ENCOUNTER — Encounter (HOSPITAL_COMMUNITY)
Admission: RE | Admit: 2012-02-15 | Discharge: 2012-02-15 | Disposition: A | Discharge status: Home / Self Care | Payer: Medicare HMO | Source: Ambulatory Visit | Attending: Nephrology | Admitting: Nephrology

## 2012-02-15 DIAGNOSIS — N183 Chronic kidney disease, stage 3 unspecified: Secondary | ICD-10-CM | POA: Insufficient documentation

## 2012-02-15 DIAGNOSIS — D638 Anemia in other chronic diseases classified elsewhere: Secondary | ICD-10-CM | POA: Insufficient documentation

## 2012-02-15 LAB — POCT HEMOGLOBIN-HEMACUE: Hemoglobin: 8.7 g/dL — ABNORMAL LOW (ref 12.0–15.0)

## 2012-02-15 MED ORDER — EPOETIN ALFA 10000 UNIT/ML IJ SOLN: 20000.0000 [IU] | INTRAMUSCULAR | Status: DC

## 2012-02-15 MED ORDER — EPOETIN ALFA 20000 UNIT/ML IJ SOLN
INTRAMUSCULAR | Status: AC
  Administered 2012-02-15: 20000 [IU] via SUBCUTANEOUS
  Filled 2012-02-15: qty 1

## 2012-02-21 ENCOUNTER — Encounter (HOSPITAL_COMMUNITY)
Admission: RE | Admit: 2012-02-21 | Discharge: 2012-02-21 | Disposition: A | Discharge status: Home / Self Care | Payer: Medicare HMO | Source: Ambulatory Visit | Attending: Nephrology | Admitting: Nephrology

## 2012-02-21 MED ORDER — EPOETIN ALFA 10000 UNIT/ML IJ SOLN
20000.0000 [IU] | INTRAMUSCULAR | Status: DC
  Administered 2012-02-21: 20000 [IU] via SUBCUTANEOUS

## 2012-02-22 ENCOUNTER — Encounter (HOSPITAL_COMMUNITY): Payer: Medicare HMO

## 2012-02-26 ENCOUNTER — Ambulatory Visit: Payer: Medicare HMO | Admitting: Cardiology

## 2012-02-28 ENCOUNTER — Encounter (HOSPITAL_COMMUNITY)
Admission: RE | Admit: 2012-02-28 | Discharge: 2012-02-28 | Disposition: A | Discharge status: Home / Self Care | Payer: Medicare HMO | Source: Ambulatory Visit | Attending: Nephrology | Admitting: Nephrology

## 2012-02-28 LAB — FERRITIN: Ferritin: 258 ng/mL (ref 10–291)

## 2012-02-28 LAB — IRON AND TIBC
Iron: 69 ug/dL (ref 42–135)
TIBC: 182 ug/dL — ABNORMAL LOW (ref 250–470)

## 2012-02-28 MED ORDER — EPOETIN ALFA 10000 UNIT/ML IJ SOLN: 20000.0000 [IU] | INTRAMUSCULAR | Status: DC

## 2012-02-28 MED ORDER — EPOETIN ALFA 20000 UNIT/ML IJ SOLN
INTRAMUSCULAR | Status: AC
  Administered 2012-02-28: 20000 [IU] via SUBCUTANEOUS
  Filled 2012-02-28: qty 1

## 2012-03-07 ENCOUNTER — Encounter (HOSPITAL_COMMUNITY)
Admission: RE | Admit: 2012-03-07 | Discharge: 2012-03-07 | Disposition: A | Discharge status: Home / Self Care | Payer: Medicare HMO | Source: Ambulatory Visit | Attending: Nephrology | Admitting: Nephrology

## 2012-03-07 MED ORDER — EPOETIN ALFA 10000 UNIT/ML IJ SOLN: 20000.0000 [IU] | INTRAMUSCULAR | Status: DC

## 2012-03-07 MED ORDER — EPOETIN ALFA 20000 UNIT/ML IJ SOLN
INTRAMUSCULAR | Status: AC
  Administered 2012-03-07: 20000 [IU] via SUBCUTANEOUS
  Filled 2012-03-07: qty 1

## 2012-03-14 ENCOUNTER — Encounter (HOSPITAL_COMMUNITY)
Admission: RE | Admit: 2012-03-14 | Discharge: 2012-03-14 | Disposition: A | Discharge status: Home / Self Care | Payer: Medicare HMO | Source: Ambulatory Visit | Attending: Nephrology | Admitting: Nephrology

## 2012-03-14 DIAGNOSIS — N183 Chronic kidney disease, stage 3 unspecified: Secondary | ICD-10-CM | POA: Insufficient documentation

## 2012-03-14 DIAGNOSIS — D638 Anemia in other chronic diseases classified elsewhere: Secondary | ICD-10-CM | POA: Insufficient documentation

## 2012-03-14 LAB — POCT HEMOGLOBIN-HEMACUE: Hemoglobin: 10.9 g/dL — ABNORMAL LOW (ref 12.0–15.0)

## 2012-03-14 MED ORDER — EPOETIN ALFA 20000 UNIT/ML IJ SOLN
INTRAMUSCULAR | Status: AC
  Administered 2012-03-14: 20000 [IU] via SUBCUTANEOUS
  Filled 2012-03-14: qty 1

## 2012-03-14 MED ORDER — EPOETIN ALFA 10000 UNIT/ML IJ SOLN: 20000.0000 [IU] | INTRAMUSCULAR | Status: DC

## 2012-03-21 ENCOUNTER — Encounter (HOSPITAL_COMMUNITY)
Admission: RE | Admit: 2012-03-21 | Discharge: 2012-03-21 | Disposition: A | Discharge status: Home / Self Care | Payer: Medicare HMO | Source: Ambulatory Visit | Attending: Nephrology | Admitting: Nephrology

## 2012-03-21 LAB — POCT HEMOGLOBIN-HEMACUE: Hemoglobin: 10.2 g/dL — ABNORMAL LOW (ref 12.0–15.0)

## 2012-03-21 MED ORDER — EPOETIN ALFA 20000 UNIT/ML IJ SOLN
INTRAMUSCULAR | Status: AC
  Administered 2012-03-21: 20000 [IU] via SUBCUTANEOUS
  Filled 2012-03-21: qty 1

## 2012-03-21 MED ORDER — EPOETIN ALFA 10000 UNIT/ML IJ SOLN: 20000.0000 [IU] | INTRAMUSCULAR | Status: DC

## 2012-03-28 ENCOUNTER — Other Ambulatory Visit (HOSPITAL_COMMUNITY): Payer: Self-pay | Admitting: *Deleted

## 2012-03-29 ENCOUNTER — Encounter (HOSPITAL_COMMUNITY)
Admission: RE | Admit: 2012-03-29 | Discharge: 2012-03-29 | Disposition: A | Discharge status: Home / Self Care | Payer: Medicare HMO | Source: Ambulatory Visit | Attending: Nephrology | Admitting: Nephrology

## 2012-03-29 LAB — IRON AND TIBC
Iron: 141 ug/dL — ABNORMAL HIGH (ref 42–135)
Saturation Ratios: 72 % — ABNORMAL HIGH (ref 20–55)

## 2012-03-29 MED ORDER — EPOETIN ALFA 20000 UNIT/ML IJ SOLN
INTRAMUSCULAR | Status: AC
  Administered 2012-03-29: 20000 [IU] via SUBCUTANEOUS
  Filled 2012-03-29: qty 1

## 2012-03-29 MED ORDER — EPOETIN ALFA 10000 UNIT/ML IJ SOLN: 20000.0000 [IU] | INTRAMUSCULAR | Status: DC

## 2012-04-05 ENCOUNTER — Encounter (HOSPITAL_COMMUNITY)
Admission: RE | Admit: 2012-04-05 | Discharge: 2012-04-05 | Disposition: A | Discharge status: Home / Self Care | Payer: Medicare HMO | Source: Ambulatory Visit | Attending: Nephrology | Admitting: Nephrology

## 2012-04-05 LAB — POCT HEMOGLOBIN-HEMACUE: Hemoglobin: 9.9 g/dL — ABNORMAL LOW (ref 12.0–15.0)

## 2012-04-05 MED ORDER — EPOETIN ALFA 20000 UNIT/ML IJ SOLN
INTRAMUSCULAR | Status: AC
  Administered 2012-04-05: 20000 [IU] via SUBCUTANEOUS
  Filled 2012-04-05: qty 1

## 2012-04-05 MED ORDER — EPOETIN ALFA 10000 UNIT/ML IJ SOLN: 20000.0000 [IU] | INTRAMUSCULAR | Status: DC

## 2012-04-12 ENCOUNTER — Encounter (HOSPITAL_COMMUNITY)
Admission: RE | Admit: 2012-04-12 | Discharge: 2012-04-12 | Disposition: A | Discharge status: Home / Self Care | Payer: Medicare HMO | Source: Ambulatory Visit | Attending: Nephrology | Admitting: Nephrology

## 2012-04-12 DIAGNOSIS — N183 Chronic kidney disease, stage 3 unspecified: Secondary | ICD-10-CM | POA: Insufficient documentation

## 2012-04-12 DIAGNOSIS — D638 Anemia in other chronic diseases classified elsewhere: Secondary | ICD-10-CM | POA: Insufficient documentation

## 2012-04-12 LAB — POCT HEMOGLOBIN-HEMACUE: Hemoglobin: 12.9 g/dL (ref 12.0–15.0)

## 2012-04-12 MED ORDER — EPOETIN ALFA 10000 UNIT/ML IJ SOLN: 20000.0000 [IU] | INTRAMUSCULAR | Status: DC

## 2012-04-26 ENCOUNTER — Encounter (HOSPITAL_COMMUNITY)
Admission: RE | Admit: 2012-04-26 | Discharge: 2012-04-26 | Disposition: A | Discharge status: Home / Self Care | Payer: Medicare HMO | Source: Ambulatory Visit | Attending: Nephrology | Admitting: Nephrology

## 2012-04-26 LAB — IRON AND TIBC: Iron: 133 ug/dL (ref 42–135)

## 2012-04-26 MED ORDER — EPOETIN ALFA 20000 UNIT/ML IJ SOLN
INTRAMUSCULAR | Status: AC
  Administered 2012-04-26: 20000 [IU] via SUBCUTANEOUS
  Filled 2012-04-26: qty 1

## 2012-04-26 MED ORDER — EPOETIN ALFA 10000 UNIT/ML IJ SOLN: 20000.0000 [IU] | INTRAMUSCULAR | Status: DC

## 2012-05-03 ENCOUNTER — Encounter (HOSPITAL_COMMUNITY)
Admission: RE | Admit: 2012-05-03 | Discharge: 2012-05-03 | Disposition: A | Discharge status: Home / Self Care | Payer: Medicare HMO | Source: Ambulatory Visit | Attending: Nephrology | Admitting: Nephrology

## 2012-05-03 MED ORDER — EPOETIN ALFA 10000 UNIT/ML IJ SOLN: 20000.0000 [IU] | INTRAMUSCULAR | Status: DC

## 2012-05-03 MED ORDER — EPOETIN ALFA 20000 UNIT/ML IJ SOLN
INTRAMUSCULAR | Status: AC
  Administered 2012-05-03: 20000 [IU] via SUBCUTANEOUS
  Filled 2012-05-03: qty 1

## 2012-05-10 ENCOUNTER — Encounter (HOSPITAL_COMMUNITY)
Admission: RE | Admit: 2012-05-10 | Discharge: 2012-05-10 | Disposition: A | Discharge status: Home / Self Care | Payer: Medicare HMO | Source: Ambulatory Visit | Attending: Nephrology | Admitting: Nephrology

## 2012-05-10 DIAGNOSIS — N183 Chronic kidney disease, stage 3 unspecified: Secondary | ICD-10-CM | POA: Insufficient documentation

## 2012-05-10 DIAGNOSIS — D638 Anemia in other chronic diseases classified elsewhere: Secondary | ICD-10-CM | POA: Insufficient documentation

## 2012-05-10 LAB — POCT HEMOGLOBIN-HEMACUE: Hemoglobin: 9 g/dL — ABNORMAL LOW (ref 12.0–15.0)

## 2012-05-10 MED ORDER — EPOETIN ALFA 20000 UNIT/ML IJ SOLN
INTRAMUSCULAR | Status: AC
  Administered 2012-05-10: 20000 [IU] via SUBCUTANEOUS
  Filled 2012-05-10: qty 1

## 2012-05-10 MED ORDER — EPOETIN ALFA 10000 UNIT/ML IJ SOLN: 20000.0000 [IU] | INTRAMUSCULAR | Status: DC

## 2012-05-16 ENCOUNTER — Encounter (HOSPITAL_COMMUNITY)
Admission: RE | Admit: 2012-05-16 | Discharge: 2012-05-16 | Disposition: A | Discharge status: Home / Self Care | Payer: Medicare HMO | Source: Ambulatory Visit | Attending: Nephrology | Admitting: Nephrology

## 2012-05-16 ENCOUNTER — Encounter (HOSPITAL_COMMUNITY): Payer: Self-pay

## 2012-05-16 LAB — POCT HEMOGLOBIN-HEMACUE: Hemoglobin: 9.4 g/dL — ABNORMAL LOW (ref 12.0–15.0)

## 2012-05-16 MED ORDER — EPOETIN ALFA 10000 UNIT/ML IJ SOLN: 20000.0000 [IU] | INTRAMUSCULAR | Status: DC

## 2012-05-16 MED ORDER — EPOETIN ALFA 20000 UNIT/ML IJ SOLN
INTRAMUSCULAR | Status: AC
  Administered 2012-05-16: 20000 [IU]
  Filled 2012-05-16: qty 1

## 2012-05-24 ENCOUNTER — Encounter (HOSPITAL_COMMUNITY)
Admission: RE | Admit: 2012-05-24 | Discharge: 2012-05-24 | Disposition: A | Discharge status: Home / Self Care | Payer: Medicare HMO | Source: Ambulatory Visit | Attending: Nephrology | Admitting: Nephrology

## 2012-05-24 LAB — IRON AND TIBC
Iron: 109 ug/dL (ref 42–135)
UIBC: 73 ug/dL — ABNORMAL LOW (ref 125–400)

## 2012-05-24 LAB — FERRITIN: Ferritin: 169 ng/mL (ref 10–291)

## 2012-05-24 LAB — POCT HEMOGLOBIN-HEMACUE: Hemoglobin: 10.2 g/dL — ABNORMAL LOW (ref 12.0–15.0)

## 2012-05-24 MED ORDER — EPOETIN ALFA 10000 UNIT/ML IJ SOLN: 20000.0000 [IU] | INTRAMUSCULAR | Status: DC

## 2012-05-24 MED ORDER — EPOETIN ALFA 20000 UNIT/ML IJ SOLN
INTRAMUSCULAR | Status: AC
  Administered 2012-05-24: 20000 [IU] via SUBCUTANEOUS
  Filled 2012-05-24: qty 1

## 2012-05-31 ENCOUNTER — Encounter (HOSPITAL_COMMUNITY)
Admission: RE | Admit: 2012-05-31 | Discharge: 2012-05-31 | Disposition: A | Discharge status: Home / Self Care | Payer: Medicare HMO | Source: Ambulatory Visit | Attending: Nephrology | Admitting: Nephrology

## 2012-05-31 MED ORDER — EPOETIN ALFA 10000 UNIT/ML IJ SOLN: 20000.0000 [IU] | INTRAMUSCULAR | Status: DC

## 2012-05-31 MED ORDER — EPOETIN ALFA 20000 UNIT/ML IJ SOLN
INTRAMUSCULAR | Status: AC
  Administered 2012-05-31: 20000 [IU] via SUBCUTANEOUS
  Filled 2012-05-31: qty 1

## 2012-06-02 ENCOUNTER — Other Ambulatory Visit: Payer: Self-pay | Admitting: Cardiology

## 2012-06-04 NOTE — Telephone Encounter (Signed)
lisinopril (PRINIVIL,ZESTRIL) 20 MG tablet  TAKE 1 TABLET BY MOUTH TWICE A DAY   60 tablet   5  Patient Instructions  Increase the Lasix to 40 mg each day for the next week. Try to minimize your salt and elevate your legs as much as possible. We are going to check labs and a chest xray today. Monitor your temperature at home. Call the [facility] office at [phone] if you have any questions, problems or concerns.  Chart Reviewed By  Peter M [place], MD  on 02/15/2011  8:32 AM  Previous Visit  Provider Department Encounter #  02/14/2011  8:03 AM Norma Fredrickson, NP [facility] [phone]

## 2012-06-07 ENCOUNTER — Encounter (HOSPITAL_COMMUNITY)
Admission: RE | Admit: 2012-06-07 | Discharge: 2012-06-07 | Disposition: A | Discharge status: Home / Self Care | Payer: Medicare HMO | Source: Ambulatory Visit | Attending: Nephrology | Admitting: Nephrology

## 2012-06-07 LAB — POCT HEMOGLOBIN-HEMACUE: Hemoglobin: 10.4 g/dL — ABNORMAL LOW (ref 12.0–15.0)

## 2012-06-07 MED ORDER — EPOETIN ALFA 20000 UNIT/ML IJ SOLN
INTRAMUSCULAR | Status: AC
  Administered 2012-06-07: 20000 [IU] via SUBCUTANEOUS
  Filled 2012-06-07: qty 1

## 2012-06-07 MED ORDER — EPOETIN ALFA 10000 UNIT/ML IJ SOLN: 20000.0000 [IU] | INTRAMUSCULAR | Status: DC

## 2012-06-13 ENCOUNTER — Encounter (HOSPITAL_COMMUNITY)
Admission: RE | Admit: 2012-06-13 | Discharge: 2012-06-13 | Disposition: A | Discharge status: Home / Self Care | Payer: Medicare HMO | Source: Ambulatory Visit | Attending: Nephrology | Admitting: Nephrology

## 2012-06-13 DIAGNOSIS — N183 Chronic kidney disease, stage 3 unspecified: Secondary | ICD-10-CM | POA: Insufficient documentation

## 2012-06-13 DIAGNOSIS — D638 Anemia in other chronic diseases classified elsewhere: Secondary | ICD-10-CM | POA: Insufficient documentation

## 2012-06-13 LAB — IRON AND TIBC
Saturation Ratios: 22 % (ref 20–55)
TIBC: 180 ug/dL — ABNORMAL LOW (ref 250–470)

## 2012-06-13 LAB — POCT HEMOGLOBIN-HEMACUE: Hemoglobin: 10.7 g/dL — ABNORMAL LOW (ref 12.0–15.0)

## 2012-06-13 MED ORDER — EPOETIN ALFA 20000 UNIT/ML IJ SOLN
INTRAMUSCULAR | Status: AC
  Filled 2012-06-13: qty 1

## 2012-06-13 MED ORDER — EPOETIN ALFA 10000 UNIT/ML IJ SOLN
20000.0000 [IU] | INTRAMUSCULAR | Status: DC
  Administered 2012-06-13: 20000 [IU] via SUBCUTANEOUS

## 2012-06-20 ENCOUNTER — Encounter (HOSPITAL_COMMUNITY)
Admission: RE | Admit: 2012-06-20 | Discharge: 2012-06-20 | Disposition: A | Discharge status: Home / Self Care | Payer: Medicare HMO | Source: Ambulatory Visit | Attending: Nephrology | Admitting: Nephrology

## 2012-06-20 LAB — POCT HEMOGLOBIN-HEMACUE: Hemoglobin: 10.4 g/dL — ABNORMAL LOW (ref 12.0–15.0)

## 2012-06-20 MED ORDER — EPOETIN ALFA 20000 UNIT/ML IJ SOLN
INTRAMUSCULAR | Status: AC
  Administered 2012-06-20: 20000 [IU] via SUBCUTANEOUS
  Filled 2012-06-20: qty 1

## 2012-06-20 MED ORDER — EPOETIN ALFA 10000 UNIT/ML IJ SOLN: 20000.0000 [IU] | INTRAMUSCULAR | Status: DC

## 2012-06-28 ENCOUNTER — Encounter (HOSPITAL_COMMUNITY)
Admission: RE | Admit: 2012-06-28 | Discharge: 2012-06-28 | Disposition: A | Discharge status: Home / Self Care | Payer: Medicare HMO | Source: Ambulatory Visit | Attending: Nephrology | Admitting: Nephrology

## 2012-06-28 MED ORDER — EPOETIN ALFA 10000 UNIT/ML IJ SOLN: 20000.0000 [IU] | INTRAMUSCULAR | Status: DC

## 2012-06-28 MED ORDER — EPOETIN ALFA 20000 UNIT/ML IJ SOLN
INTRAMUSCULAR | Status: AC
  Administered 2012-06-28: 20000 [IU] via SUBCUTANEOUS
  Filled 2012-06-28: qty 1

## 2012-07-04 ENCOUNTER — Other Ambulatory Visit (HOSPITAL_COMMUNITY): Payer: Self-pay

## 2012-07-05 ENCOUNTER — Encounter (HOSPITAL_COMMUNITY)
Admission: RE | Admit: 2012-07-05 | Discharge: 2012-07-05 | Disposition: A | Discharge status: Home / Self Care | Payer: Medicare HMO | Source: Ambulatory Visit | Attending: Nephrology | Admitting: Nephrology

## 2012-07-05 LAB — POCT HEMOGLOBIN-HEMACUE: Hemoglobin: 10.2 g/dL — ABNORMAL LOW (ref 12.0–15.0)

## 2012-07-05 MED ORDER — EPOETIN ALFA 10000 UNIT/ML IJ SOLN: 20000.0000 [IU] | INTRAMUSCULAR | Status: DC

## 2012-07-05 MED ORDER — EPOETIN ALFA 20000 UNIT/ML IJ SOLN
INTRAMUSCULAR | Status: AC
  Administered 2012-07-05: 20000 [IU]
  Filled 2012-07-05: qty 1

## 2012-07-05 MED ORDER — SODIUM CHLORIDE 0.9 % IV SOLN
1020.0000 mg | Freq: Once | INTRAVENOUS | Status: AC
  Administered 2012-07-05 (×2): 1020 mg via INTRAVENOUS
  Filled 2012-07-05: qty 34

## 2012-07-08 ENCOUNTER — Other Ambulatory Visit: Payer: Self-pay | Admitting: Cardiology

## 2012-07-10 ENCOUNTER — Other Ambulatory Visit (HOSPITAL_COMMUNITY): Payer: Self-pay | Admitting: *Deleted

## 2012-07-11 ENCOUNTER — Encounter (HOSPITAL_COMMUNITY)
Admission: RE | Admit: 2012-07-11 | Discharge: 2012-07-11 | Disposition: A | Discharge status: Home / Self Care | Payer: Medicare HMO | Source: Ambulatory Visit | Attending: Nephrology | Admitting: Nephrology

## 2012-07-11 DIAGNOSIS — D638 Anemia in other chronic diseases classified elsewhere: Secondary | ICD-10-CM | POA: Insufficient documentation

## 2012-07-11 DIAGNOSIS — N183 Chronic kidney disease, stage 3 unspecified: Secondary | ICD-10-CM | POA: Insufficient documentation

## 2012-07-11 LAB — IRON AND TIBC: UIBC: 56 ug/dL — ABNORMAL LOW (ref 125–400)

## 2012-07-11 LAB — FERRITIN: Ferritin: 943 ng/mL — ABNORMAL HIGH (ref 10–291)

## 2012-07-11 MED ORDER — EPOETIN ALFA 10000 UNIT/ML IJ SOLN: 20000.0000 [IU] | INTRAMUSCULAR | Status: DC

## 2012-07-11 MED ORDER — EPOETIN ALFA 20000 UNIT/ML IJ SOLN
INTRAMUSCULAR | Status: AC
  Administered 2012-07-11: 20000 [IU] via SUBCUTANEOUS
  Filled 2012-07-11: qty 1

## 2012-07-18 ENCOUNTER — Encounter (HOSPITAL_COMMUNITY)
Admission: RE | Admit: 2012-07-18 | Discharge: 2012-07-18 | Disposition: A | Discharge status: Home / Self Care | Payer: Medicare HMO | Source: Ambulatory Visit | Attending: Nephrology | Admitting: Nephrology

## 2012-07-18 LAB — POCT HEMOGLOBIN-HEMACUE: Hemoglobin: 10.7 g/dL — ABNORMAL LOW (ref 12.0–15.0)

## 2012-07-18 MED ORDER — EPOETIN ALFA 10000 UNIT/ML IJ SOLN: 20000.0000 [IU] | INTRAMUSCULAR | Status: DC

## 2012-07-18 MED ORDER — EPOETIN ALFA 20000 UNIT/ML IJ SOLN
INTRAMUSCULAR | Status: AC
  Administered 2012-07-18: 20000 [IU] via SUBCUTANEOUS
  Filled 2012-07-18: qty 1

## 2012-07-22 ENCOUNTER — Other Ambulatory Visit: Payer: Self-pay | Admitting: Cardiology

## 2012-07-25 ENCOUNTER — Encounter (HOSPITAL_COMMUNITY)
Admission: RE | Admit: 2012-07-25 | Discharge: 2012-07-25 | Disposition: A | Discharge status: Home / Self Care | Payer: Medicare HMO | Source: Ambulatory Visit | Attending: Nephrology | Admitting: Nephrology

## 2012-07-25 LAB — POCT HEMOGLOBIN-HEMACUE: Hemoglobin: 10.1 g/dL — ABNORMAL LOW (ref 12.0–15.0)

## 2012-07-25 MED ORDER — EPOETIN ALFA 10000 UNIT/ML IJ SOLN: 20000.0000 [IU] | INTRAMUSCULAR | Status: DC

## 2012-07-25 MED ORDER — EPOETIN ALFA 20000 UNIT/ML IJ SOLN
INTRAMUSCULAR | Status: AC
  Administered 2012-07-25: 20000 [IU] via SUBCUTANEOUS
  Filled 2012-07-25: qty 1

## 2012-08-01 ENCOUNTER — Encounter (HOSPITAL_COMMUNITY)
Admission: RE | Admit: 2012-08-01 | Discharge: 2012-08-01 | Disposition: A | Discharge status: Home / Self Care | Payer: Medicare HMO | Source: Ambulatory Visit | Attending: Nephrology | Admitting: Nephrology

## 2012-08-01 MED ORDER — EPOETIN ALFA 10000 UNIT/ML IJ SOLN: 20000.0000 [IU] | INTRAMUSCULAR | Status: DC

## 2012-08-01 MED ORDER — EPOETIN ALFA 20000 UNIT/ML IJ SOLN
INTRAMUSCULAR | Status: AC
  Administered 2012-08-01: 20000 [IU] via SUBCUTANEOUS
  Filled 2012-08-01: qty 1

## 2012-08-06 ENCOUNTER — Other Ambulatory Visit: Payer: Self-pay | Admitting: Cardiology

## 2012-08-08 ENCOUNTER — Encounter (HOSPITAL_COMMUNITY)
Admission: RE | Admit: 2012-08-08 | Discharge: 2012-08-08 | Disposition: A | Discharge status: Home / Self Care | Payer: Medicare HMO | Source: Ambulatory Visit | Attending: Nephrology | Admitting: Nephrology

## 2012-08-08 LAB — POCT HEMOGLOBIN-HEMACUE: Hemoglobin: 11.3 g/dL — ABNORMAL LOW (ref 12.0–15.0)

## 2012-08-08 MED ORDER — EPOETIN ALFA 10000 UNIT/ML IJ SOLN: 20000.0000 [IU] | INTRAMUSCULAR | Status: DC

## 2012-08-08 MED ORDER — EPOETIN ALFA 20000 UNIT/ML IJ SOLN
INTRAMUSCULAR | Status: AC
  Administered 2012-08-08: 20000 [IU] via SUBCUTANEOUS
  Filled 2012-08-08: qty 1

## 2012-08-15 ENCOUNTER — Encounter (HOSPITAL_COMMUNITY)
Admission: RE | Admit: 2012-08-15 | Discharge: 2012-08-15 | Disposition: A | Discharge status: Home / Self Care | Payer: Medicare HMO | Source: Ambulatory Visit | Attending: Nephrology | Admitting: Nephrology

## 2012-08-15 DIAGNOSIS — D638 Anemia in other chronic diseases classified elsewhere: Secondary | ICD-10-CM | POA: Insufficient documentation

## 2012-08-15 DIAGNOSIS — N183 Chronic kidney disease, stage 3 unspecified: Secondary | ICD-10-CM | POA: Insufficient documentation

## 2012-08-15 LAB — IRON AND TIBC
Iron: 93 ug/dL (ref 42–135)
Saturation Ratios: 70 % — ABNORMAL HIGH (ref 20–55)
TIBC: 132 ug/dL — ABNORMAL LOW (ref 250–470)
UIBC: 39 ug/dL — ABNORMAL LOW (ref 125–400)

## 2012-08-15 MED ORDER — EPOETIN ALFA 20000 UNIT/ML IJ SOLN
INTRAMUSCULAR | Status: AC
  Administered 2012-08-15: 20000 [IU] via SUBCUTANEOUS
  Filled 2012-08-15: qty 1

## 2012-08-15 MED ORDER — EPOETIN ALFA 10000 UNIT/ML IJ SOLN: 20000.0000 [IU] | INTRAMUSCULAR | Status: DC

## 2012-08-22 ENCOUNTER — Encounter (HOSPITAL_COMMUNITY)
Admission: RE | Admit: 2012-08-22 | Discharge: 2012-08-22 | Disposition: A | Discharge status: Home / Self Care | Payer: Medicare HMO | Source: Ambulatory Visit | Attending: Nephrology | Admitting: Nephrology

## 2012-08-22 MED ORDER — EPOETIN ALFA 20000 UNIT/ML IJ SOLN
INTRAMUSCULAR | Status: AC
  Administered 2012-08-22: 20000 [IU] via SUBCUTANEOUS
  Filled 2012-08-22: qty 1

## 2012-08-22 MED ORDER — EPOETIN ALFA 10000 UNIT/ML IJ SOLN: 20000.0000 [IU] | INTRAMUSCULAR | Status: DC

## 2012-08-29 ENCOUNTER — Encounter (HOSPITAL_COMMUNITY)
Admission: RE | Admit: 2012-08-29 | Discharge: 2012-08-29 | Disposition: A | Discharge status: Home / Self Care | Payer: Medicare HMO | Source: Ambulatory Visit | Attending: Nephrology | Admitting: Nephrology

## 2012-08-29 LAB — POCT HEMOGLOBIN-HEMACUE: Hemoglobin: 12.3 g/dL (ref 12.0–15.0)

## 2012-08-29 MED ORDER — EPOETIN ALFA 10000 UNIT/ML IJ SOLN: 20000.0000 [IU] | INTRAMUSCULAR | Status: DC

## 2012-09-10 ENCOUNTER — Other Ambulatory Visit: Payer: Self-pay | Admitting: Cardiology

## 2012-09-12 ENCOUNTER — Encounter (HOSPITAL_COMMUNITY)
Admission: RE | Admit: 2012-09-12 | Discharge: 2012-09-12 | Disposition: A | Discharge status: Home / Self Care | Payer: Medicare HMO | Source: Ambulatory Visit | Attending: Nephrology | Admitting: Nephrology

## 2012-09-12 ENCOUNTER — Encounter (HOSPITAL_COMMUNITY): Payer: Medicare HMO

## 2012-09-12 DIAGNOSIS — D638 Anemia in other chronic diseases classified elsewhere: Secondary | ICD-10-CM | POA: Insufficient documentation

## 2012-09-12 DIAGNOSIS — N183 Chronic kidney disease, stage 3 unspecified: Secondary | ICD-10-CM | POA: Insufficient documentation

## 2012-09-12 LAB — IRON AND TIBC: UIBC: <15 ug/dL — ABNORMAL LOW (ref 125–400)

## 2012-09-12 LAB — POCT HEMOGLOBIN-HEMACUE: Hemoglobin: 9.6 g/dL — ABNORMAL LOW (ref 12.0–15.0)

## 2012-09-12 LAB — FERRITIN: Ferritin: 1224 ng/mL — ABNORMAL HIGH (ref 10–291)

## 2012-09-12 MED ORDER — EPOETIN ALFA 20000 UNIT/ML IJ SOLN
INTRAMUSCULAR | Status: AC
  Administered 2012-09-12: 10:00:00
  Filled 2012-09-12: qty 1

## 2012-09-12 MED ORDER — EPOETIN ALFA 10000 UNIT/ML IJ SOLN: 20000.0000 [IU] | INTRAMUSCULAR | Status: DC

## 2012-09-14 ENCOUNTER — Other Ambulatory Visit: Payer: Self-pay | Admitting: Cardiology

## 2012-09-19 ENCOUNTER — Encounter (HOSPITAL_COMMUNITY)
Admission: RE | Admit: 2012-09-19 | Discharge: 2012-09-19 | Disposition: A | Discharge status: Home / Self Care | Payer: Medicare HMO | Source: Ambulatory Visit | Attending: Nephrology | Admitting: Nephrology

## 2012-09-19 LAB — POCT HEMOGLOBIN-HEMACUE: Hemoglobin: 9.7 g/dL — ABNORMAL LOW (ref 12.0–15.0)

## 2012-09-19 MED ORDER — EPOETIN ALFA 20000 UNIT/ML IJ SOLN
INTRAMUSCULAR | Status: AC
  Administered 2012-09-19: 20000 [IU]
  Filled 2012-09-19: qty 1

## 2012-09-19 MED ORDER — EPOETIN ALFA 10000 UNIT/ML IJ SOLN: 20000.0000 [IU] | INTRAMUSCULAR | Status: DC

## 2012-09-27 ENCOUNTER — Encounter (HOSPITAL_COMMUNITY)
Admission: RE | Admit: 2012-09-27 | Discharge: 2012-09-27 | Disposition: A | Discharge status: Home / Self Care | Payer: Medicare HMO | Source: Ambulatory Visit | Attending: Nephrology | Admitting: Nephrology

## 2012-09-27 MED ORDER — EPOETIN ALFA 10000 UNIT/ML IJ SOLN
20000.0000 [IU] | INTRAMUSCULAR | Status: DC
  Administered 2012-09-27: 20000 [IU] via SUBCUTANEOUS

## 2012-10-04 ENCOUNTER — Encounter (HOSPITAL_COMMUNITY)
Admission: RE | Admit: 2012-10-04 | Discharge: 2012-10-04 | Disposition: A | Discharge status: Home / Self Care | Payer: Medicare HMO | Source: Ambulatory Visit | Attending: Nephrology | Admitting: Nephrology

## 2012-10-04 MED ORDER — EPOETIN ALFA 20000 UNIT/ML IJ SOLN
INTRAMUSCULAR | Status: AC
  Administered 2012-10-04: 11:00:00 20000 [IU] via SUBCUTANEOUS
  Filled 2012-10-04: qty 1

## 2012-10-04 MED ORDER — EPOETIN ALFA 10000 UNIT/ML IJ SOLN: 20000.0000 [IU] | INTRAMUSCULAR | Status: DC

## 2012-10-07 ENCOUNTER — Other Ambulatory Visit (HOSPITAL_COMMUNITY): Payer: Self-pay | Admitting: *Deleted

## 2012-10-10 ENCOUNTER — Encounter (HOSPITAL_COMMUNITY)
Admission: RE | Admit: 2012-10-10 | Discharge: 2012-10-10 | Disposition: A | Discharge status: Home / Self Care | Payer: Medicare HMO | Source: Ambulatory Visit | Attending: Nephrology | Admitting: Nephrology

## 2012-10-10 DIAGNOSIS — D638 Anemia in other chronic diseases classified elsewhere: Secondary | ICD-10-CM | POA: Insufficient documentation

## 2012-10-10 DIAGNOSIS — N183 Chronic kidney disease, stage 3 unspecified: Secondary | ICD-10-CM | POA: Insufficient documentation

## 2012-10-10 LAB — POCT HEMOGLOBIN-HEMACUE: Hemoglobin: 9.3 g/dL — ABNORMAL LOW (ref 12.0–15.0)

## 2012-10-10 LAB — IRON AND TIBC
Saturation Ratios: 55 % (ref 20–55)
TIBC: 138 ug/dL — ABNORMAL LOW (ref 250–470)
UIBC: 62 ug/dL — ABNORMAL LOW (ref 125–400)

## 2012-10-10 LAB — FERRITIN: Ferritin: 522 ng/mL — ABNORMAL HIGH (ref 10–291)

## 2012-10-10 MED ORDER — EPOETIN ALFA 10000 UNIT/ML IJ SOLN: 20000.0000 [IU] | INTRAMUSCULAR | Status: DC

## 2012-10-10 MED ORDER — EPOETIN ALFA 20000 UNIT/ML IJ SOLN
INTRAMUSCULAR | Status: AC
  Filled 2012-10-10: qty 1

## 2012-10-17 ENCOUNTER — Encounter (HOSPITAL_COMMUNITY): Payer: Medicare HMO

## 2012-10-18 ENCOUNTER — Encounter (HOSPITAL_COMMUNITY)
Admission: RE | Admit: 2012-10-18 | Discharge: 2012-10-18 | Disposition: A | Discharge status: Home / Self Care | Payer: Medicare HMO | Source: Ambulatory Visit | Attending: Nephrology | Admitting: Nephrology

## 2012-10-18 MED ORDER — EPOETIN ALFA 10000 UNIT/ML IJ SOLN: 20000.0000 [IU] | INTRAMUSCULAR | Status: DC

## 2012-10-18 MED ORDER — EPOETIN ALFA 20000 UNIT/ML IJ SOLN
INTRAMUSCULAR | Status: AC
  Administered 2012-10-18: 20000 [IU]
  Filled 2012-10-18: qty 1

## 2012-10-25 ENCOUNTER — Encounter (HOSPITAL_COMMUNITY)
Admission: RE | Admit: 2012-10-25 | Discharge: 2012-10-25 | Disposition: A | Discharge status: Home / Self Care | Payer: Medicare HMO | Source: Ambulatory Visit | Attending: Nephrology | Admitting: Nephrology

## 2012-10-25 LAB — POCT HEMOGLOBIN-HEMACUE: Hemoglobin: 11.4 g/dL — ABNORMAL LOW (ref 12.0–15.0)

## 2012-10-25 MED ORDER — EPOETIN ALFA 20000 UNIT/ML IJ SOLN
INTRAMUSCULAR | Status: AC
  Administered 2012-10-25: 20000 [IU]
  Filled 2012-10-25: qty 1

## 2012-10-25 MED ORDER — EPOETIN ALFA 10000 UNIT/ML IJ SOLN: 20000.0000 [IU] | INTRAMUSCULAR | Status: DC

## 2012-11-04 ENCOUNTER — Encounter (HOSPITAL_COMMUNITY)
Admission: RE | Admit: 2012-11-04 | Discharge: 2012-11-04 | Disposition: A | Discharge status: Home / Self Care | Payer: Medicare HMO | Source: Ambulatory Visit | Attending: Nephrology | Admitting: Nephrology

## 2012-11-04 MED ORDER — EPOETIN ALFA 20000 UNIT/ML IJ SOLN
INTRAMUSCULAR | Status: AC
  Administered 2012-11-04: 11:00:00 20000 [IU] via SUBCUTANEOUS
  Filled 2012-11-04: qty 1

## 2012-11-04 MED ORDER — EPOETIN ALFA 10000 UNIT/ML IJ SOLN: 20000.0000 [IU] | INTRAMUSCULAR | Status: DC

## 2012-11-10 ENCOUNTER — Other Ambulatory Visit: Payer: Self-pay | Admitting: Cardiology

## 2012-11-11 ENCOUNTER — Encounter (HOSPITAL_COMMUNITY)
Admission: RE | Admit: 2012-11-11 | Discharge: 2012-11-11 | Disposition: A | Discharge status: Home / Self Care | Payer: Medicare HMO | Source: Ambulatory Visit | Attending: Nephrology | Admitting: Nephrology

## 2012-11-11 DIAGNOSIS — N183 Chronic kidney disease, stage 3 unspecified: Secondary | ICD-10-CM | POA: Insufficient documentation

## 2012-11-11 DIAGNOSIS — D638 Anemia in other chronic diseases classified elsewhere: Secondary | ICD-10-CM | POA: Insufficient documentation

## 2012-11-11 LAB — FERRITIN: Ferritin: 520 ng/mL — ABNORMAL HIGH (ref 10–291)

## 2012-11-11 LAB — IRON AND TIBC
Iron: 100 ug/dL (ref 42–135)
TIBC: 141 ug/dL — ABNORMAL LOW (ref 250–470)
UIBC: 41 ug/dL — ABNORMAL LOW (ref 125–400)

## 2012-11-11 MED ORDER — EPOETIN ALFA 10000 UNIT/ML IJ SOLN: 20000.0000 [IU] | INTRAMUSCULAR | Status: DC

## 2012-11-11 MED ORDER — EPOETIN ALFA 20000 UNIT/ML IJ SOLN
INTRAMUSCULAR | Status: AC
  Administered 2012-11-11: 10:00:00 20000 [IU] via SUBCUTANEOUS
  Filled 2012-11-11: qty 1

## 2012-11-19 ENCOUNTER — Encounter (HOSPITAL_COMMUNITY)
Admission: RE | Admit: 2012-11-19 | Discharge: 2012-11-19 | Disposition: A | Discharge status: Home / Self Care | Payer: Medicare HMO | Source: Ambulatory Visit | Attending: Nephrology | Admitting: Nephrology

## 2012-11-19 LAB — POCT HEMOGLOBIN-HEMACUE: Hemoglobin: 9.5 g/dL — ABNORMAL LOW (ref 12.0–15.0)

## 2012-11-19 MED ORDER — EPOETIN ALFA 20000 UNIT/ML IJ SOLN
INTRAMUSCULAR | Status: AC
  Administered 2012-11-19: 20000 [IU] via SUBCUTANEOUS
  Filled 2012-11-19: qty 1

## 2012-11-19 MED ORDER — EPOETIN ALFA 10000 UNIT/ML IJ SOLN: 20000.0000 [IU] | INTRAMUSCULAR | Status: DC

## 2012-11-22 ENCOUNTER — Telehealth: Payer: Self-pay | Admitting: Cardiology

## 2012-11-22 NOTE — Telephone Encounter (Signed)
Returned call to patient's daughter [name] no answer.LMTC.

## 2012-11-22 NOTE — Telephone Encounter (Signed)
New message     Talk to the nurse about her mother's medication

## 2012-11-25 ENCOUNTER — Encounter (HOSPITAL_COMMUNITY)
Admission: RE | Admit: 2012-11-25 | Discharge: 2012-11-25 | Disposition: A | Discharge status: Home / Self Care | Payer: Medicare HMO | Source: Ambulatory Visit | Attending: Nephrology | Admitting: Nephrology

## 2012-11-25 LAB — POCT HEMOGLOBIN-HEMACUE: Hemoglobin: 9.6 g/dL — ABNORMAL LOW (ref 12.0–15.0)

## 2012-11-25 MED ORDER — EPOETIN ALFA 20000 UNIT/ML IJ SOLN
INTRAMUSCULAR | Status: AC
  Administered 2012-11-25: 20000 [IU] via SUBCUTANEOUS
  Filled 2012-11-25: qty 1

## 2012-11-25 MED ORDER — EPOETIN ALFA 10000 UNIT/ML IJ SOLN: 20000.0000 [IU] | INTRAMUSCULAR | Status: DC

## 2012-11-26 NOTE — Telephone Encounter (Signed)
Returned call to patient's daughter [name] she stated mother needed refills for amlodipine.Stated Dr.Jordan had said she did not have to see him for 3 years.Will check with Dr.Jordan 11/27/12 about appointment  and call her back.

## 2012-11-27 LAB — POCT HEMOGLOBIN-HEMACUE: Hemoglobin: 12.5 g/dL (ref 12.0–15.0)

## 2012-11-27 MED ORDER — AMLODIPINE BESYLATE 5 MG PO TABS: 5.0000 mg | ORAL_TABLET | Freq: Every day | ORAL | Status: DC

## 2012-11-27 NOTE — Telephone Encounter (Signed)
Returned call to patient's daughter Robin no answer.LMTC. 

## 2012-11-29 MED ORDER — AMLODIPINE BESYLATE 5 MG PO TABS: 5.0000 mg | ORAL_TABLET | Freq: Every day | ORAL | Status: DC

## 2012-11-29 NOTE — Telephone Encounter (Signed)
Follow Up  Pt daughter returned the call// Made an Appt for 1/28 @ 11:30am// Pt requests a call back for refills until the appointment. Please call back to assist.

## 2012-11-29 NOTE — Telephone Encounter (Signed)
Returned call to patient's daughter [name] no answer.Left message on personal voice mail amlodipine refills sent to pharmacy

## 2012-12-01 ENCOUNTER — Other Ambulatory Visit: Payer: Self-pay | Admitting: Cardiology

## 2012-12-02 ENCOUNTER — Encounter (HOSPITAL_COMMUNITY)
Admission: RE | Admit: 2012-12-02 | Discharge: 2012-12-02 | Disposition: A | Discharge status: Home / Self Care | Payer: Medicare HMO | Source: Ambulatory Visit | Attending: Nephrology | Admitting: Nephrology

## 2012-12-02 MED ORDER — EPOETIN ALFA 20000 UNIT/ML IJ SOLN
INTRAMUSCULAR | Status: AC
  Administered 2012-12-02: 11:00:00 20000 [IU] via SUBCUTANEOUS
  Filled 2012-12-02: qty 1

## 2012-12-02 MED ORDER — EPOETIN ALFA 10000 UNIT/ML IJ SOLN: 20000.0000 [IU] | INTRAMUSCULAR | Status: DC

## 2012-12-05 ENCOUNTER — Other Ambulatory Visit: Payer: Self-pay | Admitting: Cardiology

## 2012-12-09 ENCOUNTER — Encounter (HOSPITAL_COMMUNITY)
Admission: RE | Admit: 2012-12-09 | Discharge: 2012-12-09 | Disposition: A | Discharge status: Home / Self Care | Payer: Medicare HMO | Source: Ambulatory Visit | Attending: Nephrology | Admitting: Nephrology

## 2012-12-09 DIAGNOSIS — N183 Chronic kidney disease, stage 3 unspecified: Secondary | ICD-10-CM | POA: Insufficient documentation

## 2012-12-09 DIAGNOSIS — D638 Anemia in other chronic diseases classified elsewhere: Secondary | ICD-10-CM | POA: Insufficient documentation

## 2012-12-09 LAB — POCT HEMOGLOBIN-HEMACUE: Hemoglobin: 10.8 g/dL — ABNORMAL LOW (ref 12.0–15.0)

## 2012-12-09 LAB — IRON AND TIBC
Iron: 70 ug/dL (ref 42–135)
UIBC: 64 ug/dL — ABNORMAL LOW (ref 125–400)

## 2012-12-09 LAB — FERRITIN: Ferritin: 458 ng/mL — ABNORMAL HIGH (ref 10–291)

## 2012-12-09 MED ORDER — EPOETIN ALFA 10000 UNIT/ML IJ SOLN: 20000.0000 [IU] | INTRAMUSCULAR | Status: DC

## 2012-12-09 MED ORDER — EPOETIN ALFA 20000 UNIT/ML IJ SOLN
INTRAMUSCULAR | Status: AC
  Administered 2012-12-09: 20000 [IU] via SUBCUTANEOUS
  Filled 2012-12-09: qty 1

## 2012-12-16 ENCOUNTER — Encounter (HOSPITAL_COMMUNITY)
Admission: RE | Admit: 2012-12-16 | Discharge: 2012-12-16 | Disposition: A | Discharge status: Home / Self Care | Payer: Medicare HMO | Source: Ambulatory Visit | Attending: Nephrology | Admitting: Nephrology

## 2012-12-16 MED ORDER — EPOETIN ALFA 10000 UNIT/ML IJ SOLN: 20000.0000 [IU] | INTRAMUSCULAR | Status: DC

## 2012-12-16 MED ORDER — EPOETIN ALFA 20000 UNIT/ML IJ SOLN
INTRAMUSCULAR | Status: AC
  Administered 2012-12-16: 20000 [IU]
  Filled 2012-12-16: qty 1

## 2012-12-23 ENCOUNTER — Encounter (HOSPITAL_COMMUNITY)
Admission: RE | Admit: 2012-12-23 | Discharge: 2012-12-23 | Disposition: A | Discharge status: Home / Self Care | Payer: Medicare HMO | Source: Ambulatory Visit | Attending: Nephrology | Admitting: Nephrology

## 2012-12-23 LAB — POCT HEMOGLOBIN-HEMACUE: Hemoglobin: 10.6 g/dL — ABNORMAL LOW (ref 12.0–15.0)

## 2012-12-23 MED ORDER — EPOETIN ALFA 10000 UNIT/ML IJ SOLN: 20000.0000 [IU] | INTRAMUSCULAR | Status: DC

## 2012-12-23 MED ORDER — EPOETIN ALFA 20000 UNIT/ML IJ SOLN
INTRAMUSCULAR | Status: AC
  Administered 2012-12-23: 20000 [IU] via SUBCUTANEOUS
  Filled 2012-12-23: qty 1

## 2012-12-26 LAB — POCT HEMOGLOBIN-HEMACUE: Hemoglobin: 12.8 g/dL (ref 12.0–15.0)

## 2012-12-30 ENCOUNTER — Encounter (HOSPITAL_COMMUNITY)
Admission: RE | Admit: 2012-12-30 | Discharge: 2012-12-30 | Disposition: A | Discharge status: Home / Self Care | Payer: Medicare HMO | Source: Ambulatory Visit | Attending: Nephrology | Admitting: Nephrology

## 2012-12-30 MED ORDER — EPOETIN ALFA 10000 UNIT/ML IJ SOLN: 20000.0000 [IU] | INTRAMUSCULAR | Status: DC

## 2012-12-30 MED ORDER — EPOETIN ALFA 20000 UNIT/ML IJ SOLN
INTRAMUSCULAR | Status: AC
  Administered 2012-12-30: 13:00:00 20000 [IU] via SUBCUTANEOUS
  Filled 2012-12-30: qty 1

## 2013-01-06 ENCOUNTER — Encounter (HOSPITAL_COMMUNITY)
Admission: RE | Admit: 2013-01-06 | Discharge: 2013-01-06 | Disposition: A | Discharge status: Home / Self Care | Payer: Medicare HMO | Source: Ambulatory Visit | Attending: Nephrology | Admitting: Nephrology

## 2013-01-06 LAB — IRON AND TIBC: TIBC: 154 ug/dL — ABNORMAL LOW (ref 250–470)

## 2013-01-06 LAB — POCT HEMOGLOBIN-HEMACUE: Hemoglobin: 10.4 g/dL — ABNORMAL LOW (ref 12.0–15.0)

## 2013-01-06 MED ORDER — EPOETIN ALFA 10000 UNIT/ML IJ SOLN: 20000.0000 [IU] | INTRAMUSCULAR | Status: DC

## 2013-01-06 MED ORDER — EPOETIN ALFA 20000 UNIT/ML IJ SOLN
INTRAMUSCULAR | Status: AC
  Administered 2013-01-06: 11:00:00 20000 [IU]
  Filled 2013-01-06: qty 1

## 2013-01-13 ENCOUNTER — Encounter (HOSPITAL_COMMUNITY)
Admission: RE | Admit: 2013-01-13 | Discharge: 2013-01-13 | Disposition: A | Discharge status: Home / Self Care | Payer: Medicare HMO | Source: Ambulatory Visit | Attending: Nephrology | Admitting: Nephrology

## 2013-01-13 DIAGNOSIS — N183 Chronic kidney disease, stage 3 unspecified: Secondary | ICD-10-CM | POA: Insufficient documentation

## 2013-01-13 DIAGNOSIS — D638 Anemia in other chronic diseases classified elsewhere: Secondary | ICD-10-CM | POA: Insufficient documentation

## 2013-01-13 MED ORDER — EPOETIN ALFA 10000 UNIT/ML IJ SOLN: 20000.0000 [IU] | INTRAMUSCULAR | Status: DC

## 2013-01-13 MED ORDER — EPOETIN ALFA 20000 UNIT/ML IJ SOLN
INTRAMUSCULAR | Status: AC
  Administered 2013-01-13: 20000 [IU] via SUBCUTANEOUS
  Filled 2013-01-13: qty 1

## 2013-01-14 LAB — POCT HEMOGLOBIN-HEMACUE: Hemoglobin: 10.7 g/dL — ABNORMAL LOW (ref 12.0–15.0)

## 2013-01-17 ENCOUNTER — Other Ambulatory Visit (HOSPITAL_COMMUNITY): Payer: Self-pay

## 2013-01-20 ENCOUNTER — Encounter (HOSPITAL_COMMUNITY)
Admission: RE | Admit: 2013-01-20 | Discharge: 2013-01-20 | Disposition: A | Discharge status: Home / Self Care | Payer: Medicare HMO | Source: Ambulatory Visit | Attending: Nephrology | Admitting: Nephrology

## 2013-01-20 LAB — POCT HEMOGLOBIN-HEMACUE: Hemoglobin: 9.6 g/dL — ABNORMAL LOW (ref 12.0–15.0)

## 2013-01-20 MED ORDER — EPOETIN ALFA 20000 UNIT/ML IJ SOLN
INTRAMUSCULAR | Status: AC
  Administered 2013-01-20: 20000 [IU] via SUBCUTANEOUS
  Filled 2013-01-20: qty 1

## 2013-01-20 MED ORDER — EPOETIN ALFA 10000 UNIT/ML IJ SOLN: 20000.0000 [IU] | INTRAMUSCULAR | Status: DC

## 2013-01-25 ENCOUNTER — Other Ambulatory Visit: Payer: Self-pay | Admitting: Cardiology

## 2013-01-27 ENCOUNTER — Encounter (HOSPITAL_COMMUNITY)
Admission: RE | Admit: 2013-01-27 | Discharge: 2013-01-27 | Disposition: A | Discharge status: Home / Self Care | Payer: Medicare HMO | Source: Ambulatory Visit | Attending: Nephrology | Admitting: Nephrology

## 2013-01-27 ENCOUNTER — Other Ambulatory Visit: Payer: Self-pay | Admitting: Cardiology

## 2013-01-27 LAB — POCT HEMOGLOBIN-HEMACUE
Hemoglobin: 10.6 g/dL — ABNORMAL LOW (ref 12.0–15.0)
Hemoglobin: 12.3 g/dL (ref 12.0–15.0)

## 2013-01-27 MED ORDER — EPOETIN ALFA 20000 UNIT/ML IJ SOLN
INTRAMUSCULAR | Status: AC
  Administered 2013-01-27: 20000 [IU] via SUBCUTANEOUS
  Filled 2013-01-27: qty 1

## 2013-01-27 MED ORDER — EPOETIN ALFA 10000 UNIT/ML IJ SOLN: 20000.0000 [IU] | INTRAMUSCULAR | Status: DC

## 2013-02-05 ENCOUNTER — Encounter: Payer: Self-pay | Admitting: Cardiology

## 2013-02-05 ENCOUNTER — Encounter (INDEPENDENT_AMBULATORY_CARE_PROVIDER_SITE_OTHER): Payer: Self-pay

## 2013-02-05 ENCOUNTER — Ambulatory Visit (INDEPENDENT_AMBULATORY_CARE_PROVIDER_SITE_OTHER): Payer: Medicare HMO | Admitting: Cardiology

## 2013-02-05 VITALS — BP 118/52 | HR 69 | Ht 62.0 in | Wt 176.0 lb

## 2013-02-05 DIAGNOSIS — N183 Chronic kidney disease, stage 3 unspecified: Secondary | ICD-10-CM

## 2013-02-05 DIAGNOSIS — I509 Heart failure, unspecified: Secondary | ICD-10-CM

## 2013-02-05 DIAGNOSIS — I10 Essential (primary) hypertension: Secondary | ICD-10-CM

## 2013-02-05 DIAGNOSIS — I350 Nonrheumatic aortic (valve) stenosis: Secondary | ICD-10-CM

## 2013-02-05 DIAGNOSIS — I359 Nonrheumatic aortic valve disorder, unspecified: Secondary | ICD-10-CM

## 2013-02-05 DIAGNOSIS — I5032 Chronic diastolic (congestive) heart failure: Secondary | ICD-10-CM

## 2013-02-05 DIAGNOSIS — K7460 Unspecified cirrhosis of liver: Secondary | ICD-10-CM

## 2013-02-05 NOTE — Progress Notes (Signed)
Jennifer Neal Date of Birth: [birth year] Medical Record #[id number]  History of Present Illness: Ms. Jennifer Neal is seen today for a follow up visit.  She is here with her daughter [name] [phone]). 2 weeks ago Mrs. [name] developed increased edema in her legs. This was associated with increased weight. Denies increase abdominal girth or ascites. No increase SOB. Developed gout one week ago. Given Colcrys but took only 2 doses due to GI upset. Denies chest pain or dizziness. No palpitations. She has a history of hepatic cirrhosis, aortic stenosis, and anemia-on chronic Procrit injections.  Current Outpatient Prescriptions on File Prior to Visit  Medication Sig Dispense Refill  . aspirin 81 MG EC tablet Take 81 mg by mouth daily.        Marland Kitchen Epoetin Alfa (PROCRIT IJ) Inject as directed once a week.        . ferrous gluconate (FERGON) 324 MG tablet Take 324 mg by mouth daily with breakfast.        . furosemide (LASIX) 20 MG tablet Take 1 tablet (20 mg total) by mouth daily.  30 tablet  0  . lisinopril (PRINIVIL,ZESTRIL) 20 MG tablet TAKE 1 TABLET BY MOUTH TWICE A DAY  30 tablet  0  . lisinopril (PRINIVIL,ZESTRIL) 20 MG tablet TAKE 1 TABLET BY MOUTH TWICE A DAY  60 tablet  0  . spironolactone (ALDACTONE) 25 MG tablet Take 25 mg by mouth 4 (four) times daily.         Current Facility-Administered Medications on File Prior to Visit  Medication Dose Route Frequency Provider Last Rate Last Dose  . epoetin alfa (EPOGEN,PROCRIT) injection 20,000 Units  20,000 Units Subcutaneous Weekly Jay K. Allena Katz, MD        Allergies  Allergen Reactions  . Aspirin     Patient taking an aspirin daily  . Codeine   . Penicillins   . Sulfa Drugs Cross Reactors     Past Medical History  Diagnosis Date  . Hypertension   . Aortic stenosis, moderate     moderate to severe  . Rheumatoid arthritis(714.0)   . Asthma   . Hepatic cirrhosis   . Anemia   . CKD (chronic kidney disease), stage III   . PNA  (pneumonia) Dec 2012  . Diastolic CHF, chronic   . Abnormal echocardiogram Dec 2012    mild LVH, normal systolic function, EF 60 to 65%, grade 2 diastolic dysfunction, LAE, moderate AS and mild to moderate pulmonary HTN  . Resting tremor     Past Surgical History  Procedure Laterality Date  . Abdominal hysterectomy    . Breast lumpectomy      right    History  Smoking status  . Never Smoker   Smokeless tobacco  . Not on file    History  Alcohol Use No    Family History  Problem Relation Age of Onset  . Heart attack Brother   . Cancer Brother   . Cancer Brother   . Aortic aneurysm Brother   . Cancer Sister   . Cancer Sister   . Cancer Sister   . Goiter Mother     Review of Systems: The review of systems is per the HPI.  All other systems were reviewed and are negative.  Physical Exam: BP 118/52  Pulse 69  Ht 5\' 2"  (1.575 m)  Wt 176 lb (79.833 kg)  BMI 32.18 kg/m2 Patient is an elderly female who looks chronically ill but in no acute distress.She does have  a tremor. Skin is  warm and dry.  Color is normal.  HEENT is unremarkable. Normocephalic/atraumatic. PERRL. Sclera are nonicteric. Neck is supple. No masses. No JVD. Lungs are clear. Cardiac exam shows a regular rate and rhythm. She has a harsh 3/6 aortic outlfow murmur.  Abdomen is obese but soft. Extremities are full with 1+ edema. No signs of cellulitis. She has bruising of right 3rd and 4th toes. No tenderness to palpation.  No gross neurologic deficits noted.   LABORATORY DATA:  Ecg: NSR with marked tremor artifact. Otherwise normal.   Assessment / Plan: 1. Edema- most likely related to cirrhosis with ascites. Improved with increased diuretic for several days. Recommend knee high support stockings. Sodium restriction. Continue current diuretic Rx.  2. Aortic stenosis moderate to severe. Last Echo in 2012. Will update  3. Cirrhosis. Followed by Dr. Randa Evens.  4. Anemia of chronic disease. On  Procrit.  5. CKD stage 3  6. RA

## 2013-02-05 NOTE — Patient Instructions (Signed)
Wear knee high support hose.  Avoid salt intake  We will schedule you for an echocardiogram

## 2013-02-10 ENCOUNTER — Encounter (HOSPITAL_COMMUNITY)
Admission: RE | Admit: 2013-02-10 | Discharge: 2013-02-10 | Disposition: A | Discharge status: Home / Self Care | Payer: Medicare HMO | Source: Ambulatory Visit | Attending: Nephrology | Admitting: Nephrology

## 2013-02-10 DIAGNOSIS — D638 Anemia in other chronic diseases classified elsewhere: Secondary | ICD-10-CM | POA: Insufficient documentation

## 2013-02-10 DIAGNOSIS — N183 Chronic kidney disease, stage 3 unspecified: Secondary | ICD-10-CM | POA: Insufficient documentation

## 2013-02-10 LAB — POCT HEMOGLOBIN-HEMACUE: Hemoglobin: 10 g/dL — ABNORMAL LOW (ref 12.0–15.0)

## 2013-02-10 LAB — IRON AND TIBC
IRON: 143 ug/dL — AB (ref 42–135)
SATURATION RATIOS: 84 % — AB (ref 20–55)
TIBC: 170 ug/dL — AB (ref 250–470)
UIBC: 27 ug/dL — ABNORMAL LOW (ref 125–400)

## 2013-02-10 LAB — FERRITIN: FERRITIN: 539 ng/mL — AB (ref 10–291)

## 2013-02-10 MED ORDER — EPOETIN ALFA 10000 UNIT/ML IJ SOLN: 20000.0000 [IU] | INTRAMUSCULAR | Status: DC

## 2013-02-10 MED ORDER — EPOETIN ALFA 20000 UNIT/ML IJ SOLN
INTRAMUSCULAR | Status: AC
  Administered 2013-02-10: 11:00:00 20000 [IU] via SUBCUTANEOUS
  Filled 2013-02-10: qty 1

## 2013-02-17 ENCOUNTER — Encounter (HOSPITAL_COMMUNITY)
Admission: RE | Admit: 2013-02-17 | Discharge: 2013-02-17 | Disposition: A | Discharge status: Home / Self Care | Payer: Medicare HMO | Source: Ambulatory Visit | Attending: Nephrology | Admitting: Nephrology

## 2013-02-17 LAB — POCT HEMOGLOBIN-HEMACUE: HEMOGLOBIN: 9.3 g/dL — AB (ref 12.0–15.0)

## 2013-02-17 MED ORDER — EPOETIN ALFA 10000 UNIT/ML IJ SOLN: 20000.0000 [IU] | INTRAMUSCULAR | Status: DC

## 2013-02-17 MED ORDER — EPOETIN ALFA 20000 UNIT/ML IJ SOLN
INTRAMUSCULAR | Status: AC
  Administered 2013-02-17: 20000 [IU] via SUBCUTANEOUS
  Filled 2013-02-17: qty 1

## 2013-02-24 ENCOUNTER — Encounter (HOSPITAL_COMMUNITY): Payer: Medicare HMO

## 2013-02-26 ENCOUNTER — Other Ambulatory Visit (HOSPITAL_COMMUNITY): Payer: Medicare HMO

## 2013-03-03 ENCOUNTER — Other Ambulatory Visit: Payer: Self-pay | Admitting: Cardiology

## 2013-03-03 ENCOUNTER — Encounter (HOSPITAL_COMMUNITY)
Admission: RE | Admit: 2013-03-03 | Discharge: 2013-03-03 | Disposition: A | Discharge status: Home / Self Care | Payer: Medicare HMO | Source: Ambulatory Visit | Attending: Nephrology | Admitting: Nephrology

## 2013-03-03 LAB — POCT HEMOGLOBIN-HEMACUE: Hemoglobin: 9.6 g/dL — ABNORMAL LOW (ref 12.0–15.0)

## 2013-03-03 MED ORDER — EPOETIN ALFA 10000 UNIT/ML IJ SOLN: 20000.0000 [IU] | INTRAMUSCULAR | Status: DC

## 2013-03-03 MED ORDER — EPOETIN ALFA 20000 UNIT/ML IJ SOLN
INTRAMUSCULAR | Status: AC
  Administered 2013-03-03: 20000 [IU]
  Filled 2013-03-03: qty 1

## 2013-03-03 NOTE — Telephone Encounter (Signed)
furosemide (LASIX) 20 MG tablet  Take 1 tablet (20 mg total) by mouth daily.   Assessment / Plan:  1. Edema- most likely related to cirrhosis with ascites. Improved with increased diuretic for several days. Recommend knee high support stockings. Sodium restriction. Continue current diuretic Rx. Peter M [place], MD at 02/05/2013 12:10 PM

## 2013-03-06 ENCOUNTER — Encounter (HOSPITAL_COMMUNITY): Payer: Self-pay | Admitting: Emergency Medicine

## 2013-03-06 ENCOUNTER — Emergency Department (HOSPITAL_COMMUNITY): Payer: Medicare HMO

## 2013-03-06 ENCOUNTER — Emergency Department (HOSPITAL_COMMUNITY)
Admission: EM | Admit: 2013-03-06 | Discharge: 2013-03-06 | Disposition: A | Discharge status: Home / Self Care | Payer: Medicare HMO | Attending: Emergency Medicine | Admitting: Emergency Medicine

## 2013-03-06 ENCOUNTER — Other Ambulatory Visit (HOSPITAL_COMMUNITY): Payer: Medicare HMO

## 2013-03-06 DIAGNOSIS — S2000XA Contusion of breast, unspecified breast, initial encounter: Secondary | ICD-10-CM | POA: Insufficient documentation

## 2013-03-06 DIAGNOSIS — S2002XA Contusion of left breast, initial encounter: Secondary | ICD-10-CM

## 2013-03-06 DIAGNOSIS — N183 Chronic kidney disease, stage 3 unspecified: Secondary | ICD-10-CM | POA: Insufficient documentation

## 2013-03-06 DIAGNOSIS — Y929 Unspecified place or not applicable: Secondary | ICD-10-CM | POA: Insufficient documentation

## 2013-03-06 DIAGNOSIS — Z7982 Long term (current) use of aspirin: Secondary | ICD-10-CM | POA: Insufficient documentation

## 2013-03-06 DIAGNOSIS — Z880 Allergy status to penicillin: Secondary | ICD-10-CM | POA: Insufficient documentation

## 2013-03-06 DIAGNOSIS — D649 Anemia, unspecified: Secondary | ICD-10-CM | POA: Insufficient documentation

## 2013-03-06 DIAGNOSIS — I129 Hypertensive chronic kidney disease with stage 1 through stage 4 chronic kidney disease, or unspecified chronic kidney disease: Secondary | ICD-10-CM | POA: Insufficient documentation

## 2013-03-06 DIAGNOSIS — W19XXXA Unspecified fall, initial encounter: Secondary | ICD-10-CM

## 2013-03-06 DIAGNOSIS — R42 Dizziness and giddiness: Secondary | ICD-10-CM | POA: Insufficient documentation

## 2013-03-06 DIAGNOSIS — Z8719 Personal history of other diseases of the digestive system: Secondary | ICD-10-CM | POA: Insufficient documentation

## 2013-03-06 DIAGNOSIS — J45909 Unspecified asthma, uncomplicated: Secondary | ICD-10-CM | POA: Insufficient documentation

## 2013-03-06 DIAGNOSIS — Z8701 Personal history of pneumonia (recurrent): Secondary | ICD-10-CM | POA: Insufficient documentation

## 2013-03-06 DIAGNOSIS — R296 Repeated falls: Secondary | ICD-10-CM | POA: Insufficient documentation

## 2013-03-06 DIAGNOSIS — IMO0002 Reserved for concepts with insufficient information to code with codable children: Secondary | ICD-10-CM | POA: Insufficient documentation

## 2013-03-06 DIAGNOSIS — S0081XA Abrasion of other part of head, initial encounter: Secondary | ICD-10-CM

## 2013-03-06 DIAGNOSIS — S60219A Contusion of unspecified wrist, initial encounter: Secondary | ICD-10-CM | POA: Insufficient documentation

## 2013-03-06 DIAGNOSIS — Y939 Activity, unspecified: Secondary | ICD-10-CM | POA: Insufficient documentation

## 2013-03-06 DIAGNOSIS — M109 Gout, unspecified: Secondary | ICD-10-CM

## 2013-03-06 DIAGNOSIS — M069 Rheumatoid arthritis, unspecified: Secondary | ICD-10-CM | POA: Insufficient documentation

## 2013-03-06 DIAGNOSIS — R011 Cardiac murmur, unspecified: Secondary | ICD-10-CM | POA: Insufficient documentation

## 2013-03-06 DIAGNOSIS — I5032 Chronic diastolic (congestive) heart failure: Secondary | ICD-10-CM | POA: Insufficient documentation

## 2013-03-06 DIAGNOSIS — Z79899 Other long term (current) drug therapy: Secondary | ICD-10-CM | POA: Insufficient documentation

## 2013-03-06 DIAGNOSIS — S60212A Contusion of left wrist, initial encounter: Secondary | ICD-10-CM

## 2013-03-06 LAB — COMPREHENSIVE METABOLIC PANEL
ALT: 16 U/L (ref 0–35)
AST: 21 U/L (ref 0–37)
Albumin: 2.5 g/dL — ABNORMAL LOW (ref 3.5–5.2)
Alkaline Phosphatase: 92 U/L (ref 39–117)
BUN: 17 mg/dL (ref 6–23)
CALCIUM: 8.6 mg/dL (ref 8.4–10.5)
CO2: 25 mEq/L (ref 19–32)
Chloride: 110 mEq/L (ref 96–112)
Creatinine, Ser: 1.56 mg/dL — ABNORMAL HIGH (ref 0.50–1.10)
GFR calc Af Amer: 34 mL/min — ABNORMAL LOW (ref >90)
GFR calc non Af Amer: 30 mL/min — ABNORMAL LOW (ref >90)
Glucose, Bld: 112 mg/dL — ABNORMAL HIGH (ref 70–99)
Potassium: 4.8 mEq/L (ref 3.7–5.3)
SODIUM: 143 meq/L (ref 137–147)
TOTAL PROTEIN: 5.9 g/dL — AB (ref 6.0–8.3)
Total Bilirubin: 0.9 mg/dL (ref 0.3–1.2)

## 2013-03-06 LAB — URINALYSIS, ROUTINE W REFLEX MICROSCOPIC
Bilirubin Urine: NEGATIVE
GLUCOSE, UA: NEGATIVE mg/dL
KETONES UR: NEGATIVE mg/dL
LEUKOCYTES UA: NEGATIVE
Nitrite: NEGATIVE
PH: 5 (ref 5.0–8.0)
PROTEIN: NEGATIVE mg/dL
Specific Gravity, Urine: 1.016 (ref 1.005–1.030)
Urobilinogen, UA: 0.2 mg/dL (ref 0.0–1.0)

## 2013-03-06 LAB — CBC WITH DIFFERENTIAL/PLATELET
BASOS ABS: 0.1 10*3/uL (ref 0.0–0.1)
Basophils Relative: 2 % — ABNORMAL HIGH (ref 0–1)
EOS ABS: 0.4 10*3/uL (ref 0.0–0.7)
EOS PCT: 6 % — AB (ref 0–5)
HCT: 28.8 % — ABNORMAL LOW (ref 36.0–46.0)
Hemoglobin: 9.6 g/dL — ABNORMAL LOW (ref 12.0–15.0)
Lymphocytes Relative: 15 % (ref 12–46)
Lymphs Abs: 0.9 10*3/uL (ref 0.7–4.0)
MCH: 33 pg (ref 26.0–34.0)
MCHC: 33.3 g/dL (ref 30.0–36.0)
MCV: 99 fL (ref 78.0–100.0)
Monocytes Absolute: 1.4 10*3/uL — ABNORMAL HIGH (ref 0.1–1.0)
Monocytes Relative: 23 % — ABNORMAL HIGH (ref 3–12)
Neutro Abs: 3.3 10*3/uL (ref 1.7–7.7)
Neutrophils Relative %: 54 % (ref 43–77)
PLATELETS: 117 10*3/uL — AB (ref 150–400)
RBC: 2.91 MIL/uL — ABNORMAL LOW (ref 3.87–5.11)
RDW: 15.3 % (ref 11.5–15.5)
WBC: 6.1 10*3/uL (ref 4.0–10.5)

## 2013-03-06 LAB — URINE MICROSCOPIC-ADD ON

## 2013-03-06 MED ORDER — ACETAMINOPHEN 325 MG PO TABS
650.0000 mg | ORAL_TABLET | Freq: Once | ORAL | Status: AC
  Administered 2013-03-06: 650 mg via ORAL
  Filled 2013-03-06: qty 2

## 2013-03-06 MED ORDER — TRAMADOL HCL 50 MG PO TABS: 50.0000 mg | ORAL_TABLET | Freq: Four times a day (QID) | ORAL | Status: DC | PRN

## 2013-03-06 MED ORDER — SODIUM CHLORIDE 0.9 % IV BOLUS (SEPSIS): 250.0000 mL | INTRAVENOUS | Status: DC

## 2013-03-06 NOTE — ED Notes (Signed)
IV team aware of need for start

## 2013-03-06 NOTE — ED Notes (Signed)
IV attempted without success x1, second RN at bedside.

## 2013-03-06 NOTE — ED Notes (Signed)
Attempted to gain IV access, able to draw blood off line then vein blew.

## 2013-03-06 NOTE — ED Notes (Signed)
Ortho tech notified of orders. 

## 2013-03-06 NOTE — ED Notes (Signed)
Pt in radiology, will obtain labs and started IV upon return

## 2013-03-06 NOTE — ED Provider Notes (Signed)
CSN: [id number]     Arrival date & time 03/06/13  1428 History   First MD Initiated Contact with Patient 03/06/13 1504     Chief Complaint  Patient presents with  . Fall  . Toe Pain  . Arm Injury     (Consider location/radiation/quality/duration/timing/severity/associated sxs/prior Treatment) Patient is a 80 y.o. female presenting with fall, toe pain, and arm injury. The history is provided by the patient.  Fall This is a new problem. The current episode started 6 to 12 hours ago. Episode frequency: once. The problem has been resolved. Pertinent negatives include no chest pain, no abdominal pain, no headaches and no shortness of breath. The symptoms are aggravated by standing. Nothing relieves the symptoms. She has tried nothing for the symptoms. The treatment provided moderate relief.  Toe Pain This is a new problem. The current episode started 2 days ago. The problem occurs constantly. The problem has not changed since onset.Pertinent negatives include no chest pain, no abdominal pain, no headaches and no shortness of breath. The symptoms are aggravated by walking. Nothing relieves the symptoms. She has tried nothing for the symptoms. The treatment provided no relief.  Arm Injury Associated symptoms: no back pain, no fatigue, no fever and no neck pain     Past Medical History  Diagnosis Date  . Hypertension   . Aortic stenosis, moderate     moderate to severe  . Rheumatoid arthritis(714.0)   . Asthma   . Hepatic cirrhosis   . Anemia   . CKD (chronic kidney disease), stage III   . PNA (pneumonia) Dec 2012  . Diastolic CHF, chronic   . Abnormal echocardiogram Dec 2012    mild LVH, normal systolic function, EF 60 to 65%, grade 2 diastolic dysfunction, LAE, moderate AS and mild to moderate pulmonary HTN  . Resting tremor    Past Surgical History  Procedure Laterality Date  . Abdominal hysterectomy    . Breast lumpectomy      right   Family History  Problem Relation Age of  Onset  . Heart attack Brother   . Cancer Brother   . Cancer Brother   . Aortic aneurysm Brother   . Cancer Sister   . Cancer Sister   . Cancer Sister   . Goiter Mother    History  Substance Use Topics  . Smoking status: Never Smoker   . Smokeless tobacco: Not on file  . Alcohol Use: No   OB History   Grav Para Term Preterm Abortions TAB SAB Ect Mult Living                 Review of Systems  Constitutional: Negative for fever and fatigue.  HENT: Negative for congestion and drooling.   Eyes: Negative for pain.  Respiratory: Negative for cough and shortness of breath.   Cardiovascular: Negative for chest pain.  Gastrointestinal: Negative for nausea, vomiting, abdominal pain and diarrhea.  Genitourinary: Negative for dysuria and hematuria.  Musculoskeletal: Negative for back pain, gait problem and neck pain.  Skin: Negative for color change.  Neurological: Negative for dizziness and headaches.  Hematological: Negative for adenopathy.  Psychiatric/Behavioral: Negative for behavioral problems.  All other systems reviewed and are negative.      Allergies  Codeine; Penicillins; and Sulfa drugs cross reactors  Home Medications   Current Outpatient Rx  Name  Route  Sig  Dispense  Refill  . amLODipine (NORVASC) 5 MG tablet   Oral   Take 2.5 mg by mouth  daily.          . aspirin 81 MG EC tablet   Oral   Take 81 mg by mouth at bedtime.          Marland Kitchen epoetin alfa (EPOGEN,PROCRIT) 51025 UNIT/ML injection   Subcutaneous   Inject 20,000 Units into the skin every Monday.          . ferrous sulfate 325 (65 FE) MG tablet   Oral   Take 325 mg by mouth 2 (two) times daily with a meal.         . furosemide (LASIX) 20 MG tablet   Oral   Take 20 mg by mouth every morning.         Marland Kitchen lisinopril (PRINIVIL,ZESTRIL) 20 MG tablet   Oral   Take 20 mg by mouth 2 (two) times daily.         Marland Kitchen spironolactone (ALDACTONE) 25 MG tablet   Oral   Take 100 mg by mouth every  morning.          . trolamine salicylate (ASPERCREME) 10 % cream   Topical   Apply 1 application topically as needed for muscle pain.          BP 143/43  Pulse 68  Temp(Src) 98.2 F (36.8 C) (Oral)  Resp 25  Ht 5\' 3"  (1.6 m)  Wt 170 lb (77.111 kg)  BMI 30.12 kg/m2  SpO2 98% Physical Exam  Nursing note and vitals reviewed. Constitutional: She is oriented to person, place, and time. She appears well-developed and well-nourished.  HENT:  Head: Normocephalic.  Mouth/Throat: Oropharynx is clear and moist. No oropharyngeal exudate.  Mild abrasion to the bridge of the nose.  Eyes: Conjunctivae and EOM are normal. Pupils are equal, round, and reactive to light.  Mild left periorbital bruising and swelling. Normal extraocular movements.  Neck: Normal range of motion. Neck supple.  No vertebral tenderness noted.  Cardiovascular: Normal rate, regular rhythm and intact distal pulses.  Exam reveals no gallop and no friction rub.   Murmur heard. Pulmonary/Chest: Effort normal and breath sounds normal. No respiratory distress. She has no wheezes. She exhibits tenderness (mild ecchymosis to the left lateral breast.).  Abdominal: Soft. Bowel sounds are normal. There is no tenderness. There is no rebound and no guarding.  Musculoskeletal: Normal range of motion. She exhibits edema (Mild pitting edema in bilateral lower extremities.).  Mild ecchymosis to right ventral wrist.  Mild circumferential swelling and tenderness to palpation of the left wrist.  Normal strength in bilateral lower extremities with normal active range of motion of the hips without tenderness to palpation.  Mild circumferential swelling of the first digit of the right foot with mild tenderness to palpation.  Neurological: She is alert and oriented to person, place, and time. She has normal strength. No cranial nerve deficit or sensory deficit. Coordination and gait normal.  alert, oriented x3 speech: normal in context  and clarity memory: intact grossly cranial nerves II-XII: intact motor strength: full proximally and distally no involuntary movements or tremors sensation: intact to light touch diffusely  cerebellar: finger-to-nose and heel-to-shin intact gait: normal forwards and backwards w/ minimal assistance, pt feeling slightly worsening dizziness when standing, no truncal ataxia   Skin: Skin is warm and dry.  Psychiatric: She has a normal mood and affect. Her behavior is normal.    ED Course  Procedures (including critical care time) Labs Review Labs Reviewed  CBC WITH DIFFERENTIAL - Abnormal; Notable for the following:  RBC 2.91 (*)    Hemoglobin 9.6 (*)    HCT 28.8 (*)    Platelets 117 (*)    Monocytes Relative 23 (*)    Monocytes Absolute 1.4 (*)    Eosinophils Relative 6 (*)    Basophils Relative 2 (*)    All other components within normal limits  COMPREHENSIVE METABOLIC PANEL - Abnormal; Notable for the following:    Glucose, Bld 112 (*)    Creatinine, Ser 1.56 (*)    Total Protein 5.9 (*)    Albumin 2.5 (*)    GFR calc non Af Amer 30 (*)    GFR calc Af Amer 34 (*)    All other components within normal limits  URINALYSIS, ROUTINE W REFLEX MICROSCOPIC - Abnormal; Notable for the following:    Hgb urine dipstick SMALL (*)    All other components within normal limits  URINE MICROSCOPIC-ADD ON - Abnormal; Notable for the following:    Casts HYALINE CASTS (*)    All other components within normal limits   Imaging Review Dg Chest 2 View  03/06/2013   CLINICAL DATA:  Fall.  EXAM: CHEST  2 VIEW  COMPARISON:  DG CHEST 2 VIEW dated 02/14/2011; CT-CHEST dated 12/10/2006  FINDINGS: Mediastinum and hilar structures are normal. Cardiomegaly with mild pulmonary vascular prominence noted. Mild basilar atelectasis. No pleural effusion or pneumothorax. No acute bony abnormality identified. Degenerative changes thoracic spine and both shoulders.  IMPRESSION: Cardiomegaly and mild pulmonary venous  congestion. Mild basilar atelectasis, otherwise negative exam.   Electronically Signed   By: Maisie Fushomas  Register   On: 03/06/2013 16:26   Dg Wrist Complete Left  03/06/2013   CLINICAL DATA:  Follow-up.  EXAM: LEFT WRIST - COMPLETE 3+ VIEW  COMPARISON:  None.  FINDINGS: Vascular calcification is noted consistent with atherosclerotic vascular disease. There is diffuse osteopenia and degenerative change. Mild prominence of the scapholunate space is noted on single view, if clinical symptoms are present to suggest scapholunate dissociation MRI can be obtained. No evidence of acute fracture or dislocation .  IMPRESSION: 1. No evidence of fracture or dislocation. 2. Diffuse osteopenia degenerative change. 3. Atherosclerotic vascular disease. 4. Minimal prominence of the scapholunate space, if the patient is symptomatic in this region to further evaluate for scapholunate dissociation, MRI can be obtained.   Electronically Signed   By: Maisie Fushomas  Register   On: 03/06/2013 16:29   Ct Head Wo Contrast  03/06/2013   CLINICAL DATA:  Fall with dizziness. Bruising to left eye. Left maxilla tender to palpation.  EXAM: CT HEAD WITHOUT CONTRAST  CT MAXILLOFACIAL WITHOUT CONTRAST  TECHNIQUE: Multidetector CT imaging of the head and maxillofacial structures were performed using the standard protocol without intravenous contrast. Multiplanar CT image reconstructions of the maxillofacial structures were also generated.  COMPARISON:  None.  FINDINGS: CT HEAD FINDINGS  No evidence for acute infarction, hemorrhage, mass lesion, hydrocephalus, or extra-axial fluid. Moderate cerebral and cerebellar atrophy not unexpected for age. Mild hypoattenuation of the white matter, consistent with small vessel disease. Left frontal and supraorbital scalp hematoma, relatively mild. No skull fracture. No frontal sinus fracture. No foreign body or significant scalp laceration. No contrecoup injury.  CT MAXILLOFACIAL FINDINGS  Mild mucosal thickening left  maxillary sinus. Ethmoid, sphenoid, and frontal sinuses are clear.  No maxillary sinus wall fracture. No nasal bone fracture. Mild nasal septal deviation left-to-right of 2 mm. Lamina papyracea and orbital floor intact, with no visible acute or chronic blowout injury.  Minor preseptal periorbital  soft tissue swelling on the left. No postseptal hematoma. Bilateral cataract extractions. Globes intact. Negative orbital musculature. TMJs located. Mild vascular calcification in the carotid siphon regions.  IMPRESSION: Atrophy without acute intracranial findings. Left supraorbital scalp hematoma.  No facial fracture or blowout injury. Mild preseptal periorbital soft tissue swelling on the left.   Electronically Signed   By: Davonna Belling M.D.   On: 03/06/2013 16:14   Dg Foot Complete Right  03/06/2013   CLINICAL DATA:  Fall.  EXAM: RIGHT FOOT COMPLETE - 3+ VIEW  COMPARISON:  None.  FINDINGS: Diffuse osteopenia and degenerative change present. No evidence of fracture or dislocation. Vascular calcifications present consistent atherosclerotic vascular disease.  IMPRESSION: 1. Diffuse osteopenia and degenerative change. No acute bony abnormality identified.  2. Atherosclerotic vascular disease.   Electronically Signed   By: Maisie Fus  Register   On: 03/06/2013 16:32   Ct Maxillofacial Wo Cm  03/06/2013   CLINICAL DATA:  Fall with dizziness. Bruising to left eye. Left maxilla tender to palpation.  EXAM: CT HEAD WITHOUT CONTRAST  CT MAXILLOFACIAL WITHOUT CONTRAST  TECHNIQUE: Multidetector CT imaging of the head and maxillofacial structures were performed using the standard protocol without intravenous contrast. Multiplanar CT image reconstructions of the maxillofacial structures were also generated.  COMPARISON:  None.  FINDINGS: CT HEAD FINDINGS  No evidence for acute infarction, hemorrhage, mass lesion, hydrocephalus, or extra-axial fluid. Moderate cerebral and cerebellar atrophy not unexpected for age. Mild hypoattenuation  of the white matter, consistent with small vessel disease. Left frontal and supraorbital scalp hematoma, relatively mild. No skull fracture. No frontal sinus fracture. No foreign body or significant scalp laceration. No contrecoup injury.  CT MAXILLOFACIAL FINDINGS  Mild mucosal thickening left maxillary sinus. Ethmoid, sphenoid, and frontal sinuses are clear.  No maxillary sinus wall fracture. No nasal bone fracture. Mild nasal septal deviation left-to-right of 2 mm. Lamina papyracea and orbital floor intact, with no visible acute or chronic blowout injury.  Minor preseptal periorbital soft tissue swelling on the left. No postseptal hematoma. Bilateral cataract extractions. Globes intact. Negative orbital musculature. TMJs located. Mild vascular calcification in the carotid siphon regions.  IMPRESSION: Atrophy without acute intracranial findings. Left supraorbital scalp hematoma.  No facial fracture or blowout injury. Mild preseptal periorbital soft tissue swelling on the left.   Electronically Signed   By: Davonna Belling M.D.   On: 03/06/2013 16:14     EKG Interpretation  Date/Time:  Thursday March 06 2013 14:39:44 EST Ventricular Rate:  70 PR Interval:  146 QRS Duration: 88 QT Interval:  410 QTC Calculation: 442 R Axis:   20 Text Interpretation:  Age not entered, assumed to be  80 years old for purpose of ECG interpretation Sinus rhythm Low voltage, precordial leads Nonspecific T abnormalities, lateral leads Confirmed by Shakira Los  MD, Romuald Mccaslin ([id number]) on 03/06/2013 3:36:21 PM       MDM   Final diagnoses:  Gout  Fall  Dizziness  Contusion of left wrist  Facial abrasion  Contusion of left breast    3:32 PM 80 y.o. female who presents with a fall which occurred at approximately 4 AM this morning. She states that she was getting up to use the bathroom from bed and felt lightheaded. She states that she lost her balance and fell to the ground. She's not sure how she landed. She does have  obvious bruising to her face, left breast, left wrist. She notes mild ongoing lightheadedness since that time which is gradually improving. She has not  really been ambulatory since the fall. Her and her family state that she has been well otherwise and denies any fevers or recent illness. She is afebrile and vital signs are unremarkable here. Will get screening imaging and lab work. Tylenol for pain.  Pt has had mild swelling and pain in the first digit of her right foot for the last several days. Patient does have a history of gout.  6:23 PM: No UTI, Hgb and Cr c/w baseline. Pt's pain controlled.  I re-examined the pt's left wrist, no scaphoid or snuffbox ttp. Will place in removable thumb spica anyway for protection and wrist stability.  Pt is ambulatory w/ minimal assistance, she had mild worsening of dizziness w/ standing. I discussed possibility of posterior CVA w/ family, but have a lower suspicion for this given pts otherwise normal neuro exam, ability to ambulate, the fact that the dizziness is resolving spontaneously, and the slight worsening w/ position change. Only gave 250 cc bolus here d/t moderate AS, CHF, and LE edema. Will recommend oral rehydration at home. Family and pt feel comfortable going home. I have discussed the diagnosis/risks/treatment options with the patient and family and believe the pt to be eligible for discharge home to follow-up with her pcp w/in the next week. We also discussed returning to the ED immediately if new or worsening sx occur. We discussed the sx which are most concerning (e.g., worsening dizziness, weakness, numbness, Stroke sx discussed, inability to ambulate) that necessitate immediate return. Medications administered to the patient during their visit and any new prescriptions provided to the patient are listed below.  Medications given during this visit Medications  sodium chloride 0.9 % bolus 250 mL (not administered)  acetaminophen (TYLENOL) tablet 650 mg  (650 mg Oral Given 03/06/13 1633)    New Prescriptions   TRAMADOL (ULTRAM) 50 MG TABLET    Take 1 tablet (50 mg total) by mouth every 6 (six) hours as needed.     Junius Argyle, MD 03/07/13 [phone]

## 2013-03-06 NOTE — Progress Notes (Signed)
Orthopedic Tech Progress Note Patient Details:  Jennifer Neal 1927-10-07 [id number]  Ortho Devices Type of Ortho Device: Thumb velcro splint Ortho Device/Splint Location: LUE Ortho Device/Splint Interventions: Ordered;Application   [name] 03/06/2013, 5:52 PM

## 2013-03-06 NOTE — ED Notes (Signed)
Pt remains in radiology 

## 2013-03-06 NOTE — ED Notes (Signed)
PT states she fell at 4 am while transferring from her bedside commode to her bed.  States she began feeling dizzy after she stood up after urinating.  Pt states she feels "swimmy-headed" now.   Abrasion to nose and under L eye, L orbit red and bruising to L breast.  Pt also c/o L wrist pain.  Pt also c/o great R toe pain x 1 week.

## 2013-03-08 ENCOUNTER — Other Ambulatory Visit: Payer: Self-pay | Admitting: Cardiology

## 2013-03-10 ENCOUNTER — Other Ambulatory Visit: Payer: Self-pay | Admitting: Cardiology

## 2013-03-10 ENCOUNTER — Inpatient Hospital Stay (HOSPITAL_COMMUNITY): Admission: RE | Admit: 2013-03-10 | Payer: Medicare HMO | Source: Ambulatory Visit

## 2013-03-10 MED ORDER — AMLODIPINE BESYLATE 5 MG PO TABS: ORAL_TABLET | ORAL | Status: DC

## 2013-03-10 NOTE — Telephone Encounter (Signed)
Spoke to patient's daughter [name] she stated mother has been dizzy and fell 03/06/13.Stated she went to [facility] ER 03/07/23.Stated she would like her mother to be seen.Appointment scheduled with Norma Fredrickson NP 03/12/13 at 10:30 am.

## 2013-03-10 NOTE — Telephone Encounter (Signed)
Jennifer M [place], MD at 02/05/2013 12:10 PM lisinopril (PRINIVIL,ZESTRIL) 20 MG tablet  TAKE 1 TABLET BY MOUTH TWICE A DAY   Patient Instructions  Wear knee high support hose. Avoid salt intake We will schedule you for an echocardiogram

## 2013-03-10 NOTE — Telephone Encounter (Signed)
Spoke to patient's daughter [name] she stated mother is taking Amlodipine 5 mg 1/2 tablet daily.Advised to bring all medications to her appointment with Norma Fredrickson NP 03/12/13.

## 2013-03-10 NOTE — Telephone Encounter (Signed)
Is this patient still to be taking this? If so, what dose 2.5mg  or 5mg ? Thanks, MI

## 2013-03-10 NOTE — Telephone Encounter (Signed)
Spoke to patient's daughter [name] she stated mother has been dizzy and had a fall.Stated she went to [facility] ER 03/06/13.Stated she would like her mother to be seen.Appointment scheduled with Norma Fredrickson NP

## 2013-03-12 ENCOUNTER — Ambulatory Visit: Payer: Medicare HMO | Admitting: Nurse Practitioner

## 2013-03-14 ENCOUNTER — Other Ambulatory Visit: Payer: Self-pay

## 2013-03-14 MED ORDER — AMLODIPINE BESYLATE 5 MG PO TABS: ORAL_TABLET | ORAL | Status: DC

## 2013-03-14 MED ORDER — LISINOPRIL 20 MG PO TABS: ORAL_TABLET | ORAL | Status: DC

## 2013-03-14 MED ORDER — FUROSEMIDE 20 MG PO TABS: 20.0000 mg | ORAL_TABLET | Freq: Every morning | ORAL | Status: DC

## 2013-03-18 ENCOUNTER — Encounter (HOSPITAL_COMMUNITY)
Admission: RE | Admit: 2013-03-18 | Discharge: 2013-03-18 | Disposition: A | Discharge status: Home / Self Care | Payer: Medicare HMO | Source: Ambulatory Visit | Attending: Nephrology | Admitting: Nephrology

## 2013-03-18 DIAGNOSIS — N183 Chronic kidney disease, stage 3 unspecified: Secondary | ICD-10-CM | POA: Insufficient documentation

## 2013-03-18 DIAGNOSIS — D638 Anemia in other chronic diseases classified elsewhere: Secondary | ICD-10-CM | POA: Insufficient documentation

## 2013-03-18 LAB — FERRITIN: Ferritin: 626 ng/mL — ABNORMAL HIGH (ref 10–291)

## 2013-03-18 LAB — IRON AND TIBC
IRON: 85 ug/dL (ref 42–135)
Saturation Ratios: 61 % — ABNORMAL HIGH (ref 20–55)
TIBC: 140 ug/dL — ABNORMAL LOW (ref 250–470)
UIBC: 55 ug/dL — AB (ref 125–400)

## 2013-03-18 MED ORDER — EPOETIN ALFA 20000 UNIT/ML IJ SOLN
INTRAMUSCULAR | Status: AC
  Filled 2013-03-18: qty 1

## 2013-03-18 MED ORDER — EPOETIN ALFA 10000 UNIT/ML IJ SOLN
20000.0000 [IU] | INTRAMUSCULAR | Status: DC
  Administered 2013-03-18: 20000 [IU] via SUBCUTANEOUS

## 2013-03-19 ENCOUNTER — Encounter: Payer: Self-pay | Admitting: Physician Assistant

## 2013-03-19 ENCOUNTER — Ambulatory Visit (HOSPITAL_COMMUNITY): Payer: Medicare HMO | Attending: Cardiovascular Disease | Admitting: Cardiology

## 2013-03-19 ENCOUNTER — Ambulatory Visit (INDEPENDENT_AMBULATORY_CARE_PROVIDER_SITE_OTHER): Payer: Medicare HMO | Admitting: Physician Assistant

## 2013-03-19 VITALS — BP 147/58 | HR 56 | Ht 63.0 in | Wt 175.0 lb

## 2013-03-19 DIAGNOSIS — I498 Other specified cardiac arrhythmias: Secondary | ICD-10-CM

## 2013-03-19 DIAGNOSIS — N183 Chronic kidney disease, stage 3 unspecified: Secondary | ICD-10-CM

## 2013-03-19 DIAGNOSIS — I5032 Chronic diastolic (congestive) heart failure: Secondary | ICD-10-CM | POA: Insufficient documentation

## 2013-03-19 DIAGNOSIS — I359 Nonrheumatic aortic valve disorder, unspecified: Secondary | ICD-10-CM

## 2013-03-19 DIAGNOSIS — K7460 Unspecified cirrhosis of liver: Secondary | ICD-10-CM

## 2013-03-19 DIAGNOSIS — I509 Heart failure, unspecified: Secondary | ICD-10-CM

## 2013-03-19 DIAGNOSIS — R001 Bradycardia, unspecified: Secondary | ICD-10-CM

## 2013-03-19 DIAGNOSIS — I10 Essential (primary) hypertension: Secondary | ICD-10-CM | POA: Insufficient documentation

## 2013-03-19 DIAGNOSIS — I350 Nonrheumatic aortic (valve) stenosis: Secondary | ICD-10-CM

## 2013-03-19 DIAGNOSIS — R42 Dizziness and giddiness: Secondary | ICD-10-CM

## 2013-03-19 DIAGNOSIS — R609 Edema, unspecified: Secondary | ICD-10-CM

## 2013-03-19 MED FILL — Epoetin Alfa Inj 20000 Unit/ML: INTRAMUSCULAR | Qty: 1 | Status: AC

## 2013-03-19 NOTE — Progress Notes (Signed)
[address] [place], [place]  [address] Phone: [phone] Fax:  [phone]  Date:  03/19/2013   ID:  Jennifer Neal, Jennifer Neal 01-09-1928, MRN [id number]  PCP:  Herb Grays, MD  Cardiologist:  Dr. Peter [place]     History of Present Illness: Jennifer Neal is a 80 y.o. female with a hx of mod to severe AS, diastolic CHF, HTN, CKD stage 3, hepatic cirrhosis with portal HTN by CT, RA, chronic anemia.  Last seen by Dr. Peter [place] in 01/2013.  She was then seen in the ED 03/06/13 after feeling lightheaded and falling.  She was given some IVFs.  There were no fractures noted.  She was d/c to home.    She tells me that she got up at 4AM to go to the bathroom.  She got dizzy and fell.  She denies passing out.  She denies any hx of chest pain.  She denies significant dyspnea.  She is limited in her mobility and walks with a walker.  She sleeps on 2 pillows chronically.  No PND.  Chronic LE edema is unchanged.   Echo (12/2010):  Mild LVH, EF 60-65%, Gr 2 DD, mod AS (mean 33 mmHg, peak 52 mmHg), mild TR, RVSP 40-50 (mod pulm HTN), severe LAE, mild RAE, mild to mod MR.   Recent Labs: 03/06/2013: ALT 16; Creatinine 1.56*; Hemoglobin 9.6*; Potassium 4.8   CXR (03/06/13): IMPRESSION: Cardiomegaly and mild pulmonary venous congestion. Mild basilar atelectasis, otherwise negative exam.   Wt Readings from Last 3 Encounters:  03/19/13 175 lb (79.379 kg)  03/06/13 170 lb (77.111 kg)  02/05/13 176 lb (79.833 kg)     Past Medical History  Diagnosis Date  . Hypertension   . Aortic stenosis, moderate     moderate to severe  . Rheumatoid arthritis(714.0)   . Asthma   . Hepatic cirrhosis   . Anemia   . CKD (chronic kidney disease), stage III   . PNA (pneumonia) Dec 2012  . Diastolic CHF, chronic   . Abnormal echocardiogram Dec 2012    mild LVH, normal systolic function, EF 60 to 65%, grade 2 diastolic dysfunction, LAE, moderate AS and mild to moderate pulmonary HTN  . Resting  tremor     Current Outpatient Prescriptions  Medication Sig Dispense Refill  . amLODipine (NORVASC) 5 MG tablet Take 1/2 tablet daily  90 tablet  1  . aspirin 81 MG EC tablet Take 81 mg by mouth at bedtime.       Marland Kitchen epoetin alfa (EPOGEN,PROCRIT) 69485 UNIT/ML injection Inject 20,000 Units into the skin every Monday.       . ferrous sulfate 325 (65 FE) MG tablet Take 325 mg by mouth 2 (two) times daily with a meal.      . furosemide (LASIX) 20 MG tablet Take 1 tablet (20 mg total) by mouth every morning.  90 tablet  1  . lisinopril (PRINIVIL,ZESTRIL) 20 MG tablet TAKE 1 TABLET BY MOUTH TWICE A DAY  180 tablet  1  . spironolactone (ALDACTONE) 25 MG tablet Take 100 mg by mouth every morning.       . trolamine salicylate (ASPERCREME) 10 % cream Apply 1 application topically as needed for muscle pain.       No current facility-administered medications for this visit.   Facility-Administered Medications Ordered in Other Visits  Medication Dose Route Frequency Provider Last Rate Last Dose  . epoetin alfa (EPOGEN,PROCRIT) injection 20,000 Units  20,000 Units Subcutaneous  Weekly Hartley Barefoot. Allena Katz, MD        Allergies:   Codeine; Penicillins; and Sulfa drugs cross reactors   Social History:  The patient  reports that she has never smoked. She does not have any smokeless tobacco history on file. She reports that she does not drink alcohol or use illicit drugs.   Family History:  The patient's family history includes Aortic aneurysm in her brother; Cancer in her brother, brother, sister, sister, and sister; Goiter in her mother; Heart attack in her brother.   ROS:  Please see the history of present illness.   She has had some chills.  No dysuria, fever, cough, diarrhea.     All other systems reviewed and negative.   PHYSICAL EXAM: VS:  BP 147/58  Pulse 56  Ht 5\' 3"  (1.6 m)  Wt 175 lb (79.379 kg)  BMI 31.01 kg/m2  Filed Vitals:   03/19/13 1025 03/19/13 1026 03/19/13 1027  BP: 130/49 140/61 147/58    Pulse: 58 64 56  Height: 5\' 3"  (1.6 m)  5\' 3"  (1.6 m)  Weight: 175 lb (79.379 kg)  175 lb (79.379 kg)     Well nourished, well developed, in no acute distress HEENT: normal Neck: no JVDat 90 Cardiac:  normal S1, diminished S2; RRR; harsh 3/6 crescendo-decrescendo systolic murmur heard best at the RUSB Lungs:  clear to auscultation bilaterally, no wheezing, rhonchi or rales Abd: soft, nontender, no hepatomegaly Ext: 1+ bilateral LE edema Skin: warm and dry Neuro:  CNs 2-12 intact, no focal abnormalities noted  EKG:  NSR, HR 56, normal axis, baseline artifact due to tremor     ASSESSMENT AND PLAN:  1. Dizziness:  Suspect her BP was low when she stood to go to the BR. She has not had a recurrence. BPs at home tend to run 120-130s systolic.  Given current guidelines, we can accept less than 150/90 as her target.  At her advanced age and hx of mod to severe AS, I would suggest allowing her BP to increase some.  DC Norvasc.  Monitor BP at home.  If runs high (>/= 150/90), resume Norvasc and call 05/19/13.  HR in the 50s.  Will also check 24 hr Holter.  She should review with primary care to see if referral to PT is in order to help with balance.   2. Aortic Stenosis:  F/u echo done this AM.  Result are pending.  No symptoms to suggest critical AS. 3. Edema:  Multifactorial.  Largely related to cirrhosis.  Continue with compression stockings, Lasix + Spironolactone, elevation.  Stopping Norvasc may help.  Albumin is low.  This may be contributing as well.  I have asked her to check with GI to see if it is ok to start Ensure or Boost.   4. Chronic Diastolic CHF:  Volume stable.  Continue current Rx. 5. Hypertension:  Controlled.  Adjust medications as noted.  6. Chronic Kidney Disease:  Recent creatinine stable.  7. Cirrhosis:  F/u with GI as planned.  8. Disposition:  F/u with Dr. Peter as planned in 07/2013 or sooner PRN.   Signed, [place], PA-C  03/19/2013 10:36 AM

## 2013-03-19 NOTE — Progress Notes (Signed)
Echo performed. 

## 2013-03-19 NOTE — Patient Instructions (Addendum)
STOP NORVASC   MONITOR BLOOD PRESSURE DAILY FOR THE NEXT 1-2 WEEKS AND IF BP IS STAYING ABOVE 150/90 THEN RESUME NORVASC 2.5 MG DAILY AND CALL SCOTT WEAVER, PAC TO LET us KNOW IF YOU HAVE HAD TO RESUME THE NORVASC. [phone]  Your physician wants you to follow-up in: 4 MONTHS WITH DR. Swazil[place]. You will receive a reminder letter in the mail two months in advance. If you don't receive a letter, please call our office to schedule the follow-up appointment.   Your physician has recommended that you wear a 24 HOUR holter monitor. Holter monitors are medical devices that record the heart's electrical activity. Doctors most often use these monitors to diagnose arrhythmias. Arrhythmias are problems with the speed or rhythm of the heartbeat. The monitor is a small, portable device. You can wear one while you do your normal daily activities. This is usually used to diagnose what is causing palpitations/syncope (passing out).

## 2013-03-20 LAB — POCT HEMOGLOBIN-HEMACUE: Hemoglobin: 8.7 g/dL — ABNORMAL LOW (ref 12.0–15.0)

## 2013-03-21 ENCOUNTER — Encounter: Payer: Self-pay | Admitting: Radiology

## 2013-03-21 ENCOUNTER — Encounter (INDEPENDENT_AMBULATORY_CARE_PROVIDER_SITE_OTHER): Payer: Medicare HMO

## 2013-03-21 DIAGNOSIS — R42 Dizziness and giddiness: Secondary | ICD-10-CM

## 2013-03-21 DIAGNOSIS — R001 Bradycardia, unspecified: Secondary | ICD-10-CM

## 2013-03-21 NOTE — Progress Notes (Signed)
Patient ID: Jennifer Neal, female   DOB: Jun 13, 1927, 80 y.o.   MRN: [id number] E cardio 24hr holter applied

## 2013-03-24 IMAGING — CR DG CHEST 2V
2 series · 2 of 2 positions shown · non-contrast
Comparison: CT chest of 12/10/2006

CLINICAL DATA: Cough, fever

CHEST - 2 VIEW

[view not recorded (1 of 2)]
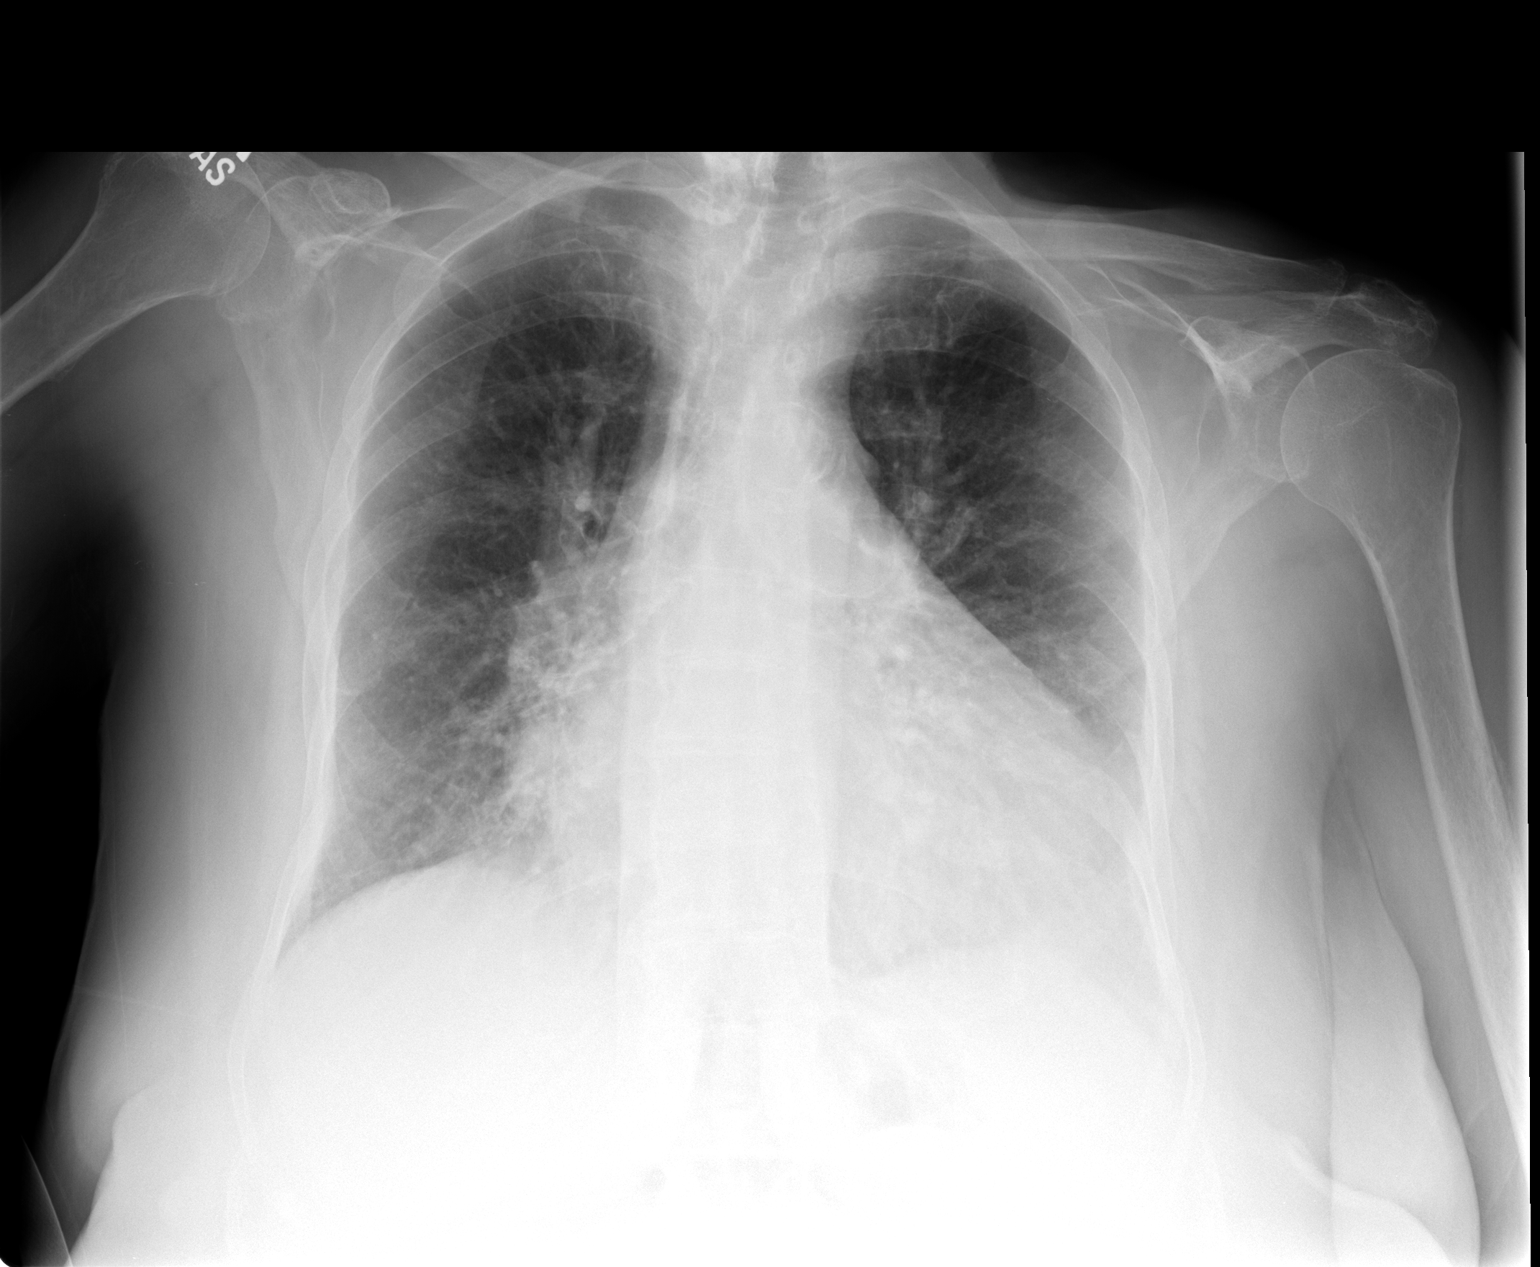

[view not recorded (2 of 2)]
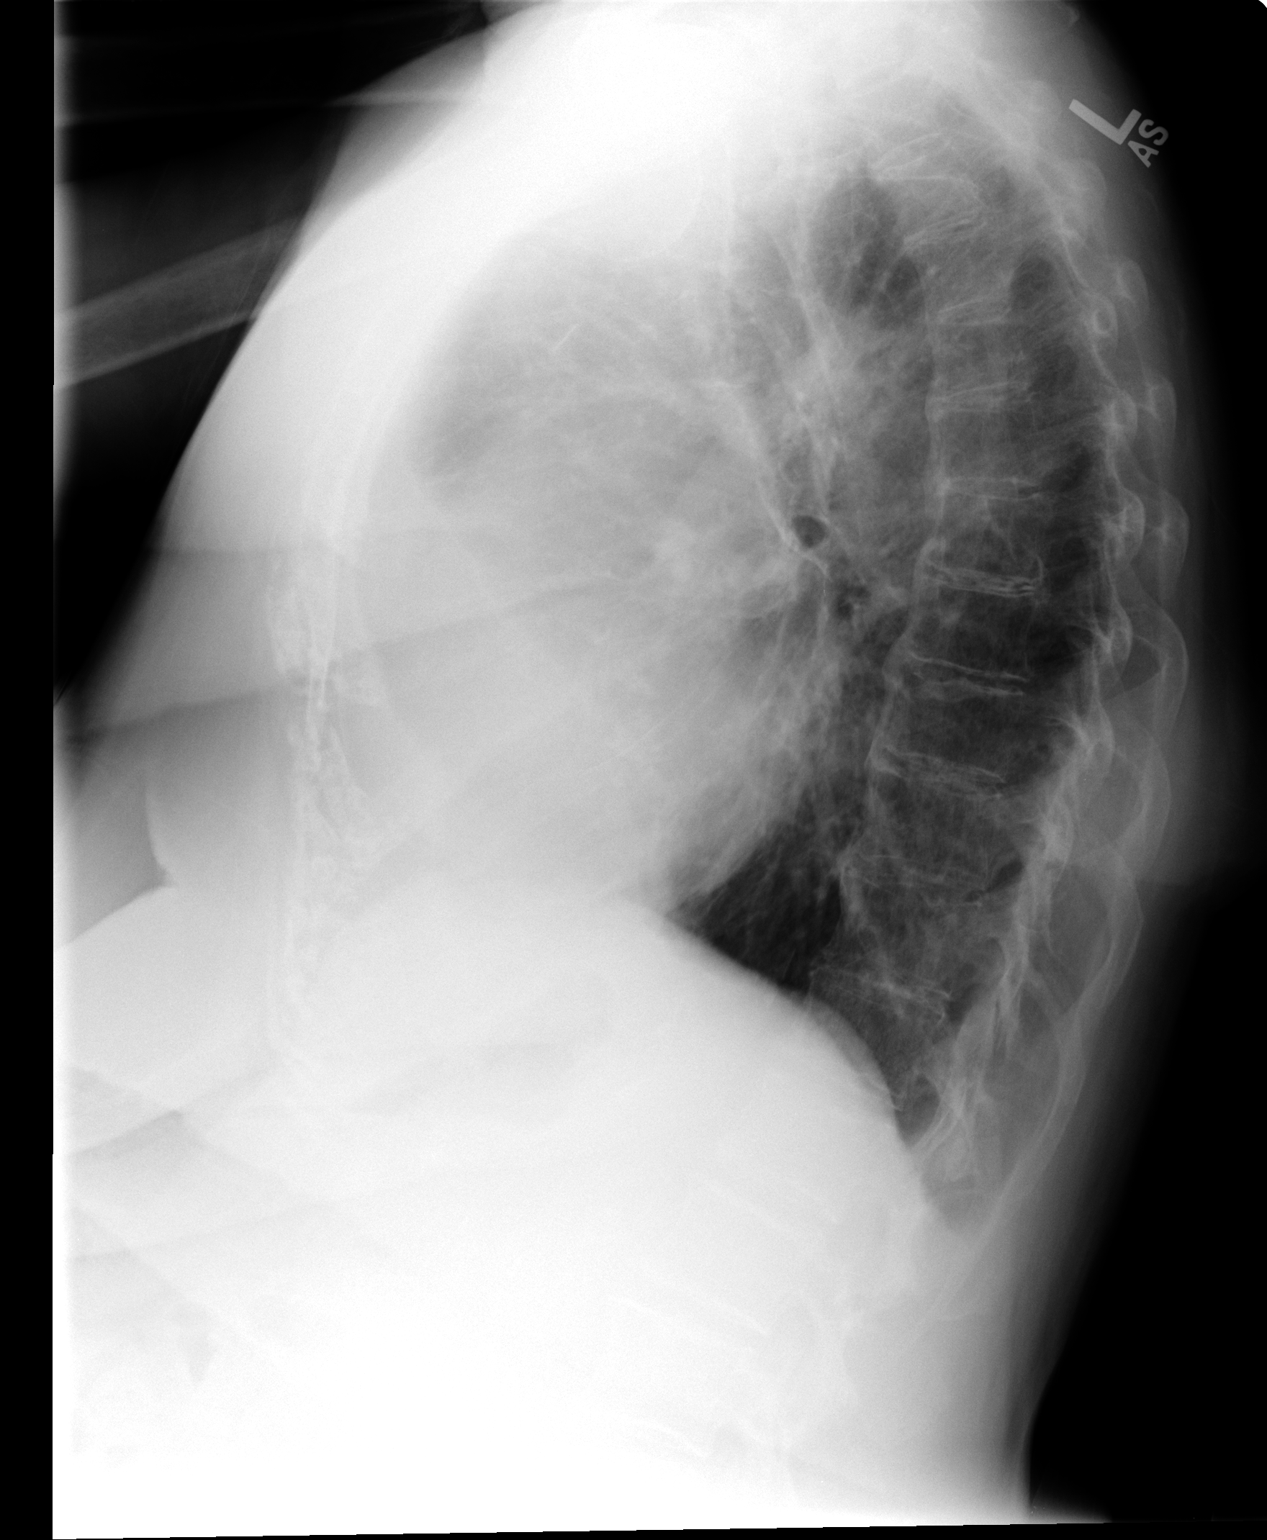

[2 of 2 positions shown; findings below may reference images not displayed]

FINDINGS: The lungs are clear but hyperaerated possibly indicating
mild COPD.  There is peribronchial thickening which may indicate
bronchitis. Moderate cardiomegaly is noted.  Mediastinal contours
are unremarkable.  There are diffuse degenerative changes
throughout the thoracic spine and the bones are osteopenic.  There
is more prominent soft tissue on the lateral view overlying the mid
lower back.  This may simply represent overlapping skin, but a soft
tissue lesion cannot be excluded.  Clinical correlation is
recommended.
IMPRESSION: 1.  Moderate cardiomegaly.
2.  Peribronchial thickening and hyperaeration may indicate COPD
and possibly bronchitis.
3.  Prominent focus of soft tissue in the mid lower back.
Correlate clinically.

## 2013-03-25 ENCOUNTER — Telehealth: Payer: Self-pay

## 2013-03-25 ENCOUNTER — Encounter (HOSPITAL_COMMUNITY)
Admission: RE | Admit: 2013-03-25 | Discharge: 2013-03-25 | Disposition: A | Discharge status: Home / Self Care | Payer: Medicare HMO | Source: Ambulatory Visit | Attending: Nephrology | Admitting: Nephrology

## 2013-03-25 LAB — POCT HEMOGLOBIN-HEMACUE: HEMOGLOBIN: 9.2 g/dL — AB (ref 12.0–15.0)

## 2013-03-25 MED ORDER — EPOETIN ALFA 20000 UNIT/ML IJ SOLN
INTRAMUSCULAR | Status: AC
  Administered 2013-03-25: 20000 [IU] via SUBCUTANEOUS
  Filled 2013-03-25: qty 1

## 2013-03-25 MED ORDER — EPOETIN ALFA 10000 UNIT/ML IJ SOLN: 20000.0000 [IU] | INTRAMUSCULAR | Status: DC

## 2013-03-25 NOTE — Telephone Encounter (Signed)
Spoke to patient's daughter [name] echo results given.Daughter stated mother's B/P has been 178 to 157/40 to 50.Daughter will start back norvasc 2.5 mg daily as advised by Tereso Newcomer PA. Advised to continue to monitor B/P.Appointment with Dr.Jordan scheduled 05/23/13 at 3:30 pm.

## 2013-03-28 ENCOUNTER — Telehealth: Payer: Self-pay

## 2013-03-28 NOTE — Telephone Encounter (Signed)
Patient called Dr.Jordan reviewed 24 hour holter monitor which revealed pac's with short runs ( 4 beats ),rare pvc's. Advised to continue same medications and keep appointment with Dr.Jordan 05/23/13 at 3:30 pm.

## 2013-04-01 ENCOUNTER — Encounter (HOSPITAL_COMMUNITY): Payer: Medicare HMO

## 2013-04-03 ENCOUNTER — Encounter (HOSPITAL_COMMUNITY)
Admission: RE | Admit: 2013-04-03 | Discharge: 2013-04-03 | Disposition: A | Discharge status: Home / Self Care | Payer: Medicare HMO | Source: Ambulatory Visit | Attending: Nephrology | Admitting: Nephrology

## 2013-04-03 LAB — FERRITIN: Ferritin: 515 ng/mL — ABNORMAL HIGH (ref 10–291)

## 2013-04-03 LAB — IRON AND TIBC
Iron: 91 ug/dL (ref 42–135)
Saturation Ratios: 63 % — ABNORMAL HIGH (ref 20–55)
TIBC: 145 ug/dL — ABNORMAL LOW (ref 250–470)
UIBC: 54 ug/dL — ABNORMAL LOW (ref 125–400)

## 2013-04-03 LAB — POCT HEMOGLOBIN-HEMACUE: Hemoglobin: 9.4 g/dL — ABNORMAL LOW (ref 12.0–15.0)

## 2013-04-03 MED ORDER — EPOETIN ALFA 20000 UNIT/ML IJ SOLN
INTRAMUSCULAR | Status: AC
  Administered 2013-04-03: 20000 [IU] via SUBCUTANEOUS
  Filled 2013-04-03: qty 1

## 2013-04-03 MED ORDER — EPOETIN ALFA 10000 UNIT/ML IJ SOLN: 20000.0000 [IU] | INTRAMUSCULAR | Status: DC

## 2013-04-09 ENCOUNTER — Telehealth: Payer: Self-pay | Admitting: Cardiology

## 2013-04-09 NOTE — Telephone Encounter (Signed)
Returned call to patient's daughter Jennifer Neal patient was called 03/28/13 with monitor results which revealed pac's.Dr.Jordan advised to continue same medications and keep appointment 05/23/13 at 3:30 pm.

## 2013-04-09 NOTE — Telephone Encounter (Signed)
Patients daughter would like the results of the 24 holter monitor. Please call and advise.

## 2013-04-10 ENCOUNTER — Encounter (HOSPITAL_COMMUNITY): Payer: Medicare HMO

## 2013-04-10 ENCOUNTER — Encounter (HOSPITAL_COMMUNITY)
Admission: RE | Admit: 2013-04-10 | Discharge: 2013-04-10 | Disposition: A | Discharge status: Home / Self Care | Payer: Medicare HMO | Source: Ambulatory Visit | Attending: Nephrology | Admitting: Nephrology

## 2013-04-10 DIAGNOSIS — D638 Anemia in other chronic diseases classified elsewhere: Secondary | ICD-10-CM | POA: Insufficient documentation

## 2013-04-10 DIAGNOSIS — N183 Chronic kidney disease, stage 3 unspecified: Secondary | ICD-10-CM | POA: Insufficient documentation

## 2013-04-10 LAB — POCT HEMOGLOBIN-HEMACUE: HEMOGLOBIN: 9.7 g/dL — AB (ref 12.0–15.0)

## 2013-04-10 MED ORDER — EPOETIN ALFA 20000 UNIT/ML IJ SOLN
INTRAMUSCULAR | Status: AC
  Administered 2013-04-10: 20000 [IU]
  Filled 2013-04-10: qty 1

## 2013-04-10 MED ORDER — EPOETIN ALFA 10000 UNIT/ML IJ SOLN: 20000.0000 [IU] | INTRAMUSCULAR | Status: DC

## 2013-04-17 ENCOUNTER — Encounter (HOSPITAL_COMMUNITY): Payer: Medicare HMO

## 2013-04-21 ENCOUNTER — Encounter (HOSPITAL_COMMUNITY)
Admission: RE | Admit: 2013-04-21 | Discharge: 2013-04-21 | Disposition: A | Discharge status: Home / Self Care | Payer: Medicare HMO | Source: Ambulatory Visit | Attending: Nephrology | Admitting: Nephrology

## 2013-04-21 LAB — POCT HEMOGLOBIN-HEMACUE: Hemoglobin: 9.2 g/dL — ABNORMAL LOW (ref 12.0–15.0)

## 2013-04-21 MED ORDER — EPOETIN ALFA 10000 UNIT/ML IJ SOLN
20000.0000 [IU] | INTRAMUSCULAR | Status: DC
  Administered 2013-04-21: 20000 [IU] via SUBCUTANEOUS

## 2013-04-21 MED ORDER — EPOETIN ALFA 20000 UNIT/ML IJ SOLN
INTRAMUSCULAR | Status: AC
  Filled 2013-04-21: qty 1

## 2013-04-28 ENCOUNTER — Encounter (HOSPITAL_COMMUNITY): Payer: Medicare HMO

## 2013-05-08 ENCOUNTER — Encounter (HOSPITAL_COMMUNITY)
Admission: RE | Admit: 2013-05-08 | Discharge: 2013-05-08 | Disposition: A | Discharge status: Home / Self Care | Payer: Medicare HMO | Source: Ambulatory Visit | Attending: Nephrology | Admitting: Nephrology

## 2013-05-08 LAB — POCT HEMOGLOBIN-HEMACUE: HEMOGLOBIN: 8.8 g/dL — AB (ref 12.0–15.0)

## 2013-05-08 MED ORDER — EPOETIN ALFA 20000 UNIT/ML IJ SOLN
INTRAMUSCULAR | Status: AC
  Administered 2013-05-08: 20000 [IU] via SUBCUTANEOUS
  Filled 2013-05-08: qty 1

## 2013-05-08 MED ORDER — EPOETIN ALFA 10000 UNIT/ML IJ SOLN: 20000.0000 [IU] | INTRAMUSCULAR | Status: DC

## 2013-05-19 ENCOUNTER — Encounter (HOSPITAL_COMMUNITY)
Admission: RE | Admit: 2013-05-19 | Discharge: 2013-05-19 | Disposition: A | Discharge status: Home / Self Care | Payer: Medicare HMO | Source: Ambulatory Visit | Attending: Nephrology | Admitting: Nephrology

## 2013-05-19 DIAGNOSIS — N183 Chronic kidney disease, stage 3 unspecified: Secondary | ICD-10-CM | POA: Insufficient documentation

## 2013-05-19 DIAGNOSIS — D638 Anemia in other chronic diseases classified elsewhere: Secondary | ICD-10-CM | POA: Insufficient documentation

## 2013-05-19 LAB — POCT HEMOGLOBIN-HEMACUE: Hemoglobin: 9 g/dL — ABNORMAL LOW (ref 12.0–15.0)

## 2013-05-19 LAB — FERRITIN: Ferritin: 567 ng/mL — ABNORMAL HIGH (ref 10–291)

## 2013-05-19 LAB — IRON AND TIBC
Iron: 124 ug/dL (ref 42–135)
Saturation Ratios: 78 % — ABNORMAL HIGH (ref 20–55)
TIBC: 158 ug/dL — ABNORMAL LOW (ref 250–470)
UIBC: 34 ug/dL — AB (ref 125–400)

## 2013-05-19 MED ORDER — EPOETIN ALFA 20000 UNIT/ML IJ SOLN
INTRAMUSCULAR | Status: AC
  Administered 2013-05-19: 20000 [IU] via SUBCUTANEOUS
  Filled 2013-05-19: qty 1

## 2013-05-19 MED ORDER — EPOETIN ALFA 10000 UNIT/ML IJ SOLN: 20000.0000 [IU] | INTRAMUSCULAR | Status: DC

## 2013-05-23 ENCOUNTER — Ambulatory Visit (INDEPENDENT_AMBULATORY_CARE_PROVIDER_SITE_OTHER): Payer: Medicare HMO | Admitting: Cardiology

## 2013-05-23 ENCOUNTER — Encounter: Payer: Self-pay | Admitting: Cardiology

## 2013-05-23 VITALS — BP 124/50 | HR 68 | Wt 170.0 lb

## 2013-05-23 DIAGNOSIS — I5032 Chronic diastolic (congestive) heart failure: Secondary | ICD-10-CM

## 2013-05-23 DIAGNOSIS — I359 Nonrheumatic aortic valve disorder, unspecified: Secondary | ICD-10-CM

## 2013-05-23 DIAGNOSIS — I509 Heart failure, unspecified: Secondary | ICD-10-CM

## 2013-05-23 DIAGNOSIS — I10 Essential (primary) hypertension: Secondary | ICD-10-CM

## 2013-05-23 DIAGNOSIS — I350 Nonrheumatic aortic (valve) stenosis: Secondary | ICD-10-CM

## 2013-05-23 NOTE — Patient Instructions (Signed)
Continue your current therapy  I will see you in 4 months  

## 2013-05-23 NOTE — Progress Notes (Signed)
Jennifer Neal Date of Birth: 1927-09-06 Medical Record #[id number]  History of Present Illness: Jennifer Neal is seen today for a follow up visit.  She is here with her daughter [name] [phone]). She has a history of severe aortic stenosis. She also has a history of hepatic cirrhosis, and anemia-on chronic Procrit injections.She has HTN. She was seen by Tereso Newcomer PA in March with symptoms of dizziness. Her Norvasc was held but then her BP went up over 180 systolic and it was resumed at 2.5 mg daily. She still has swelling in her legs. She cannot put support hose on unless someone is there to help her. No chest pain or SOB.   Current Outpatient Prescriptions on File Prior to Visit  Medication Sig Dispense Refill  . aspirin 81 MG EC tablet Take 81 mg by mouth at bedtime.       Marland Kitchen epoetin alfa (EPOGEN,PROCRIT) 73710 UNIT/ML injection Inject 20,000 Units into the skin every Monday.       . ferrous sulfate 325 (65 FE) MG tablet Take 325 mg by mouth 2 (two) times daily with a meal.      . furosemide (LASIX) 20 MG tablet Take 1 tablet (20 mg total) by mouth every morning.  90 tablet  1  . lisinopril (PRINIVIL,ZESTRIL) 20 MG tablet TAKE 1 TABLET BY MOUTH TWICE A DAY  180 tablet  1  . spironolactone (ALDACTONE) 25 MG tablet Take 100 mg by mouth every morning.       . trolamine salicylate (ASPERCREME) 10 % cream Apply 1 application topically as needed for muscle pain.       Current Facility-Administered Medications on File Prior to Visit  Medication Dose Route Frequency Provider Last Rate Last Dose  . epoetin alfa (EPOGEN,PROCRIT) injection 20,000 Units  20,000 Units Subcutaneous Weekly Jay K. Allena Katz, MD        Allergies  Allergen Reactions  . Codeine   . Penicillins   . Sulfa Drugs Cross Reactors     Past Medical History  Diagnosis Date  . Hypertension   . Aortic stenosis, moderate     moderate to severe  . Rheumatoid arthritis(714.0)   . Asthma   . Hepatic cirrhosis   . Anemia    . CKD (chronic kidney disease), stage III   . PNA (pneumonia) Dec 2012  . Diastolic CHF, chronic   . Abnormal echocardiogram Dec 2012    mild LVH, normal systolic function, EF 60 to 65%, grade 2 diastolic dysfunction, LAE, moderate AS and mild to moderate pulmonary HTN  . Resting tremor     Past Surgical History  Procedure Laterality Date  . Abdominal hysterectomy    . Breast lumpectomy      right    History  Smoking status  . Never Smoker   Smokeless tobacco  . Not on file    History  Alcohol Use No    Family History  Problem Relation Age of Onset  . Heart attack Brother   . Cancer Brother   . Cancer Brother   . Aortic aneurysm Brother   . Cancer Sister   . Cancer Sister   . Cancer Sister   . Goiter Mother     Review of Systems: The review of systems is per the HPI.  All other systems were reviewed and are negative.  Physical Exam: BP 124/50  Pulse 68  Wt 170 lb (77.111 kg) Patient is an elderly female who looks chronically ill but in no acute  distress.She does have a tremor. Skin is  warm and dry.  Color is normal.  HEENT is unremarkable. Normocephalic/atraumatic. PERRL. Sclera are nonicteric. Neck is supple. No masses. No JVD. Lungs are clear. Cardiac exam shows a regular rate and rhythm. She has a harsh 3/6 aortic outlfow murmur.  Abdomen is obese but soft. Extremities are full with 1+ edema. No signs of cellulitis. She has bruising of right 3rd and 4th toes. No tenderness to palpation.  No gross neurologic deficits noted.   LABORATORY DATA:  Holter monitor: NSR with few PACs, few runs up to 4 beats. Rare PVCs.   Echo:Transthoracic Echocardiography  Patient: Jennifer Neal, Jennifer Neal MR #: [id number] Study Date: 03/19/2013 Gender: F Age: 37 Height: 157.5cm Weight: 79.8kg BSA: 1.18m^2 Pt. Status: Room:  ORDERING Swaz[place], Peter REFERRING Swaz[place], Peter ATTENDING Charlton Haws SONOGRAPHER Samule Ohm PERFORMING [facility],  Outpatient cc:  ------------------------------------------------------------ LV EF: 55% - 60%  ------------------------------------------------------------ Indications: 424.1 Aortic valve disorders.  ------------------------------------------------------------ History: PMH: Acquired from the patient and from the patient's chart. Congestive heart failure. Aortic stenosis. Aortic valve disease. Primary pulmonary hypertension. Risk factors: Hypertension.  ------------------------------------------------------------ Study Conclusions  - Left ventricle: The cavity size was normal. Wall thickness was increased in a pattern of moderate LVH. Systolic function was normal. The estimated ejection fraction was in the range of 55% to 60%. - Aortic valve: There was severe stenosis. - Mitral valve: Severe subvalvular calcification with rheumatic looking valve. No MS Mild regurgitation. - Left atrium: The atrium was mildly dilated. - Atrial septum: No defect or patent foramen ovale was identified. - Pulmonary arteries: PA peak pressure: 70mm Hg (S). Transthoracic echocardiography. M-mode, complete 2D, spectral Doppler, and color Doppler. Height: Height: 157.5cm. Height: 62in. Weight: Weight: 79.8kg. Weight: 175.6lb. Body mass index: BMI: 32.2kg/m^2. Body surface area: BSA: 1.59m^2. Blood pressure: 118/52. Patient status: Outpatient. Location: [facility]  ------------------------------------------------------------  ------------------------------------------------------------ Left ventricle: The cavity size was normal. Wall thickness was increased in a pattern of moderate LVH. Systolic function was normal. The estimated ejection fraction was in the range of 55% to 60%.  ------------------------------------------------------------ Aortic valve: Severely calcified leaflets. Doppler: There was severe stenosis. VTI ratio of LVOT to aortic valve: 0.23. Valve area: 0.73cm^2(VTI).  Indexed valve area: 0.4cm^2/m^2 (VTI). Peak velocity ratio of LVOT to aortic valve: 0.25. Valve area: 0.77cm^2 (Vmax). Indexed valve area: 0.43cm^2/m^2 (Vmax). Mean gradient: 38mm Hg (S). Peak gradient: 35mm Hg (S).  ------------------------------------------------------------ Aorta: The aorta was normal, not dilated, and non-diseased.  ------------------------------------------------------------ Mitral valve: Severe subvalvular calcification with rheumatic looking valve. No MS Doppler: Mild regurgitation. Valve area by pressure half-time: 2.53cm^2. Indexed valve area by pressure half-time: 1.4cm^2/m^2. Valve area by continuity equation (using LVOT flow): 1.69cm^2. Indexed valve area by continuity equation (using LVOT flow): 0.93cm^2/m^2. Mean gradient: 49mm Hg (D). Peak gradient: 4mm Hg (D).  ------------------------------------------------------------ Left atrium: The atrium was mildly dilated.  ------------------------------------------------------------ Atrial septum: No defect or patent foramen ovale was identified.  ------------------------------------------------------------ Right ventricle: The cavity size was normal. Wall thickness was normal. Systolic function was normal.  ------------------------------------------------------------ Pulmonic valve: Structurally normal valve. Cusp separation was normal. Doppler: Transvalvular velocity was within the normal range. Trivial regurgitation.  ------------------------------------------------------------ Tricuspid valve: Structurally normal valve. Leaflet separation was normal. Doppler: Transvalvular velocity was within the normal range. Mild regurgitation.  ------------------------------------------------------------ Right atrium: The atrium was normal in size.  ------------------------------------------------------------ Pericardium: The pericardium was normal in  appearance.  ------------------------------------------------------------ Post procedure conclusions Ascending Aorta:  - The aorta was normal, not dilated, and non-diseased.  ------------------------------------------------------------  2D measurements Normal Doppler measurements Normal Left ventricle Main pulmonary LVID ED, 49.4 mm 43-52 artery chord, Pressure, 43 mm Hg =30 PLAX S LVID ES, 34 mm 23-38 Left ventricle chord, Ea, lat 6.43 cm/s ------ PLAX ann, tiss FS, chord, 31 % >29 DP PLAX E/Ea, lat 20.0 ------ LVPW, ED 13.2 mm ------ ann, tiss 6 IVS/LVPW 1.11 <1.3 DP ratio, ED Ea, med 6.43 cm/s ------ Ventricular septum ann, tiss IVS, ED 14.7 mm ------ DP LVOT E/Ea, med 20.0 ------ Diam, S 20 mm ------ ann, tiss 6 Area 3.14 cm^2 ------ DP Diam 20 mm ------ LVOT Aorta Peak vel, 102 cm/s ------ Root diam, 31 mm ------ S ED VTI, S 26.7 cm ------ AAo AP 34 mm ------ Stroke vol 83.9 ml ------ diam, S Stroke 46.3 ml/m^2 ------ Left atrium index AP dim 44 mm ------ Aortic valve AP dim 2.43 cm/m^2 <2.2 Peak vel, 415 cm/s ------ index S Mean vel, 300 cm/s ------ S VTI, S 115 cm ------ Mean 40 mm Hg ------ gradient, S Peak 69 mm Hg ------ gradient, S VTI ratio 0.23 ------ LVOT/AV Area, VTI 0.73 cm^2 ------ Area index 0.4 cm^2/m ------ (VTI) ^2 Peak vel 0.25 ------ ratio, LVOT/AV Area, Vmax 0.77 cm^2 ------ Area index 0.43 cm^2/m ------ (Vmax) ^2 Mitral valve Peak E vel 129 cm/s ------ Peak A vel 119 cm/s ------ Mean vel, 114 cm/s ------ D Decelerati 342 ms 150-23 on time 0 Pressure 87 ms ------ half-time Mean 6 mm Hg ------ gradient, D Peak 7 mm Hg ------ gradient, D Peak E/A 1.1 ------ ratio Area (PHT) 2.53 cm^2 ------ Area index 1.4 cm^2/m ------ (PHT) ^2 Area 1.69 cm^2 ------ (LVOT) continuity Area index 0.93 cm^2/m ------ (LVOT ^2 cont) Annulus 49.7 cm ------ VTI Tricuspid valve Regurg 309 cm/s ------ peak vel Peak RV-RA 38 mm Hg  ------ gradient, S Max regurg 309 cm/s ------ vel Systemic veins Estimated 5 mm Hg ------ CVP Right ventricle Pressure, 43 mm Hg <30 S Sa vel, 12.2 cm/s ------ lat ann, tiss DP  ------------------------------------------------------------ Prepared and Electronically Authenticated by  Charlton Haws 2015-03-11T11:09:05.823    Assessment / Plan: 1. Edema- most likely related to cirrhosis with ascites.  Recommend knee high support stockings as much as possible. Sodium restriction. Continue current diuretic Rx.  2. Aortic stenosis severe. Recent Echo shows mild progression since 2012 with mean gradient increase from 33>>40 mm Hg. I don't think she is significantly symptomatic. She is not a candidate for open AVR. I think with her cirrhosis she is also a poor candidate for TAVR. Will follow clinically.  3. Cirrhosis. Followed by Dr. Randa Evens.  4. Anemia of chronic disease. On Procrit.  5. CKD stage 3  6. RA

## 2013-06-09 ENCOUNTER — Encounter (HOSPITAL_COMMUNITY)
Admission: RE | Admit: 2013-06-09 | Discharge: 2013-06-09 | Disposition: A | Discharge status: Home / Self Care | Payer: Medicare HMO | Source: Ambulatory Visit | Attending: Nephrology | Admitting: Nephrology

## 2013-06-09 DIAGNOSIS — D638 Anemia in other chronic diseases classified elsewhere: Secondary | ICD-10-CM | POA: Insufficient documentation

## 2013-06-09 DIAGNOSIS — N183 Chronic kidney disease, stage 3 unspecified: Secondary | ICD-10-CM | POA: Insufficient documentation

## 2013-06-09 LAB — POCT HEMOGLOBIN-HEMACUE: HEMOGLOBIN: 8 g/dL — AB (ref 12.0–15.0)

## 2013-06-09 MED ORDER — EPOETIN ALFA 20000 UNIT/ML IJ SOLN
INTRAMUSCULAR | Status: AC
  Administered 2013-06-09: 20000 [IU] via SUBCUTANEOUS
  Filled 2013-06-09: qty 1

## 2013-06-09 MED ORDER — EPOETIN ALFA 10000 UNIT/ML IJ SOLN: 20000.0000 [IU] | INTRAMUSCULAR | Status: DC

## 2013-06-23 ENCOUNTER — Inpatient Hospital Stay (HOSPITAL_COMMUNITY): Admission: RE | Admit: 2013-06-23 | Payer: Medicare HMO | Source: Ambulatory Visit

## 2013-06-26 ENCOUNTER — Encounter (HOSPITAL_COMMUNITY)
Admission: RE | Admit: 2013-06-26 | Discharge: 2013-06-26 | Disposition: A | Discharge status: Home / Self Care | Payer: Medicare HMO | Source: Ambulatory Visit | Attending: Nephrology | Admitting: Nephrology

## 2013-06-26 LAB — IRON AND TIBC
IRON: 129 ug/dL (ref 42–135)
Saturation Ratios: 81 % — ABNORMAL HIGH (ref 20–55)
TIBC: 160 ug/dL — AB (ref 250–470)
UIBC: 31 ug/dL — AB (ref 125–400)

## 2013-06-26 LAB — FERRITIN: Ferritin: 554 ng/mL — ABNORMAL HIGH (ref 10–291)

## 2013-06-26 LAB — POCT HEMOGLOBIN-HEMACUE: Hemoglobin: 7.9 g/dL — ABNORMAL LOW (ref 12.0–15.0)

## 2013-06-26 MED ORDER — EPOETIN ALFA 10000 UNIT/ML IJ SOLN
INTRAMUSCULAR | Status: AC
  Administered 2013-06-26: 10000 [IU] via SUBCUTANEOUS
  Filled 2013-06-26: qty 1

## 2013-06-26 MED ORDER — EPOETIN ALFA 10000 UNIT/ML IJ SOLN: 30000.0000 [IU] | INTRAMUSCULAR | Status: DC

## 2013-06-26 MED ORDER — EPOETIN ALFA 20000 UNIT/ML IJ SOLN
INTRAMUSCULAR | Status: AC
  Administered 2013-06-26: 20000 [IU] via SUBCUTANEOUS
  Filled 2013-06-26: qty 1

## 2013-06-26 NOTE — Progress Notes (Signed)
hemocue today was 7.9.  Pt missed last week and the time before that on 6/1 she had a hemocue of 8.  Dr Allena Katz sent a new order to increase her procrit to 30,000units and that dose will start as of today.  I reported all of the above to [place], CMA at [facility].  No new orders given.

## 2013-07-03 ENCOUNTER — Encounter (HOSPITAL_COMMUNITY): Payer: Medicare HMO

## 2013-07-04 ENCOUNTER — Encounter (HOSPITAL_COMMUNITY)
Admission: RE | Admit: 2013-07-04 | Discharge: 2013-07-04 | Disposition: A | Discharge status: Home / Self Care | Payer: Medicare HMO | Source: Ambulatory Visit | Attending: Nephrology | Admitting: Nephrology

## 2013-07-04 MED ORDER — EPOETIN ALFA 10000 UNIT/ML IJ SOLN: 30000.0000 [IU] | INTRAMUSCULAR | Status: DC

## 2013-07-04 MED ORDER — EPOETIN ALFA 20000 UNIT/ML IJ SOLN
INTRAMUSCULAR | Status: AC
  Administered 2013-07-04: 20000 [IU] via SUBCUTANEOUS
  Filled 2013-07-04: qty 1

## 2013-07-04 MED ORDER — EPOETIN ALFA 10000 UNIT/ML IJ SOLN
INTRAMUSCULAR | Status: AC
  Administered 2013-07-04: 10000 [IU] via SUBCUTANEOUS
  Filled 2013-07-04: qty 1

## 2013-07-07 LAB — POCT HEMOGLOBIN-HEMACUE: Hemoglobin: 8.3 g/dL — ABNORMAL LOW (ref 12.0–15.0)

## 2013-07-10 ENCOUNTER — Encounter (HOSPITAL_COMMUNITY)
Admission: RE | Admit: 2013-07-10 | Discharge: 2013-07-10 | Disposition: A | Discharge status: Home / Self Care | Payer: Medicare HMO | Source: Ambulatory Visit | Attending: Nephrology | Admitting: Nephrology

## 2013-07-10 DIAGNOSIS — N183 Chronic kidney disease, stage 3 unspecified: Secondary | ICD-10-CM | POA: Diagnosis not present

## 2013-07-10 DIAGNOSIS — D638 Anemia in other chronic diseases classified elsewhere: Secondary | ICD-10-CM | POA: Diagnosis not present

## 2013-07-10 LAB — POCT HEMOGLOBIN-HEMACUE: Hemoglobin: 9.3 g/dL — ABNORMAL LOW (ref 12.0–15.0)

## 2013-07-10 MED ORDER — EPOETIN ALFA 10000 UNIT/ML IJ SOLN
INTRAMUSCULAR | Status: AC
  Administered 2013-07-10: 10000 [IU] via SUBCUTANEOUS
  Filled 2013-07-10: qty 1

## 2013-07-10 MED ORDER — EPOETIN ALFA 10000 UNIT/ML IJ SOLN: 30000.0000 [IU] | INTRAMUSCULAR | Status: DC

## 2013-07-10 MED ORDER — EPOETIN ALFA 20000 UNIT/ML IJ SOLN
INTRAMUSCULAR | Status: AC
  Administered 2013-07-10: 20000 [IU] via SUBCUTANEOUS
  Filled 2013-07-10: qty 1

## 2013-07-18 ENCOUNTER — Encounter (HOSPITAL_COMMUNITY): Payer: Medicare HMO

## 2013-07-22 ENCOUNTER — Encounter (HOSPITAL_COMMUNITY)
Admission: RE | Admit: 2013-07-22 | Discharge: 2013-07-22 | Disposition: A | Discharge status: Home / Self Care | Payer: Medicare HMO | Source: Ambulatory Visit | Attending: Nephrology | Admitting: Nephrology

## 2013-07-22 DIAGNOSIS — D638 Anemia in other chronic diseases classified elsewhere: Secondary | ICD-10-CM | POA: Diagnosis not present

## 2013-07-22 LAB — IRON AND TIBC
Iron: 98 ug/dL (ref 42–135)
Saturation Ratios: 54 % (ref 20–55)
TIBC: 183 ug/dL — ABNORMAL LOW (ref 250–470)
UIBC: 85 ug/dL — AB (ref 125–400)

## 2013-07-22 LAB — FERRITIN: FERRITIN: 563 ng/mL — AB (ref 10–291)

## 2013-07-22 LAB — POCT HEMOGLOBIN-HEMACUE: Hemoglobin: 9.3 g/dL — ABNORMAL LOW (ref 12.0–15.0)

## 2013-07-22 MED ORDER — EPOETIN ALFA 20000 UNIT/ML IJ SOLN
INTRAMUSCULAR | Status: AC
  Administered 2013-07-22: 20000 [IU] via SUBCUTANEOUS
  Filled 2013-07-22: qty 1

## 2013-07-22 MED ORDER — EPOETIN ALFA 10000 UNIT/ML IJ SOLN
30000.0000 [IU] | INTRAMUSCULAR | Status: DC
  Administered 2013-07-22: 10000 [IU] via SUBCUTANEOUS

## 2013-07-22 MED ORDER — EPOETIN ALFA 10000 UNIT/ML IJ SOLN
INTRAMUSCULAR | Status: AC
  Filled 2013-07-22: qty 1

## 2013-08-01 ENCOUNTER — Encounter (HOSPITAL_COMMUNITY): Payer: Medicare HMO

## 2013-08-01 ENCOUNTER — Encounter (HOSPITAL_COMMUNITY)
Admission: RE | Admit: 2013-08-01 | Discharge: 2013-08-01 | Disposition: A | Discharge status: Home / Self Care | Payer: Medicare HMO | Source: Ambulatory Visit | Attending: Nephrology | Admitting: Nephrology

## 2013-08-01 DIAGNOSIS — D638 Anemia in other chronic diseases classified elsewhere: Secondary | ICD-10-CM | POA: Diagnosis not present

## 2013-08-01 LAB — POCT HEMOGLOBIN-HEMACUE: HEMOGLOBIN: 9.5 g/dL — AB (ref 12.0–15.0)

## 2013-08-01 MED ORDER — EPOETIN ALFA 10000 UNIT/ML IJ SOLN
INTRAMUSCULAR | Status: AC
  Administered 2013-08-01: 10000 [IU] via SUBCUTANEOUS
  Filled 2013-08-01: qty 1

## 2013-08-01 MED ORDER — EPOETIN ALFA 20000 UNIT/ML IJ SOLN
INTRAMUSCULAR | Status: AC
  Administered 2013-08-01: 20000 [IU] via SUBCUTANEOUS
  Filled 2013-08-01: qty 1

## 2013-08-01 MED ORDER — EPOETIN ALFA 10000 UNIT/ML IJ SOLN: 30000.0000 [IU] | INTRAMUSCULAR | Status: DC

## 2013-08-11 ENCOUNTER — Encounter (HOSPITAL_COMMUNITY): Payer: Medicare HMO

## 2013-08-12 ENCOUNTER — Encounter (HOSPITAL_COMMUNITY)
Admission: RE | Admit: 2013-08-12 | Discharge: 2013-08-12 | Disposition: A | Discharge status: Home / Self Care | Payer: Medicare HMO | Source: Ambulatory Visit | Attending: Nephrology | Admitting: Nephrology

## 2013-08-12 DIAGNOSIS — N183 Chronic kidney disease, stage 3 unspecified: Secondary | ICD-10-CM | POA: Diagnosis not present

## 2013-08-12 DIAGNOSIS — D638 Anemia in other chronic diseases classified elsewhere: Secondary | ICD-10-CM | POA: Diagnosis not present

## 2013-08-12 LAB — POCT HEMOGLOBIN-HEMACUE: HEMOGLOBIN: 9.6 g/dL — AB (ref 12.0–15.0)

## 2013-08-12 MED ORDER — EPOETIN ALFA 10000 UNIT/ML IJ SOLN
INTRAMUSCULAR | Status: AC
  Administered 2013-08-12: 10000 [IU] via SUBCUTANEOUS
  Filled 2013-08-12: qty 1

## 2013-08-12 MED ORDER — EPOETIN ALFA 10000 UNIT/ML IJ SOLN: 30000.0000 [IU] | INTRAMUSCULAR | Status: DC

## 2013-08-12 MED ORDER — EPOETIN ALFA 20000 UNIT/ML IJ SOLN
INTRAMUSCULAR | Status: AC
  Administered 2013-08-12: 20000 [IU] via SUBCUTANEOUS
  Filled 2013-08-12: qty 1

## 2013-08-19 ENCOUNTER — Encounter (HOSPITAL_COMMUNITY): Payer: Medicare HMO

## 2013-08-19 ENCOUNTER — Encounter (HOSPITAL_COMMUNITY)
Admission: RE | Admit: 2013-08-19 | Discharge: 2013-08-19 | Disposition: A | Discharge status: Home / Self Care | Payer: Medicare HMO | Source: Ambulatory Visit | Attending: Nephrology | Admitting: Nephrology

## 2013-08-19 DIAGNOSIS — D638 Anemia in other chronic diseases classified elsewhere: Secondary | ICD-10-CM | POA: Diagnosis not present

## 2013-08-19 LAB — POCT HEMOGLOBIN-HEMACUE: Hemoglobin: 9.5 g/dL — ABNORMAL LOW (ref 12.0–15.0)

## 2013-08-19 LAB — IRON AND TIBC
Iron: 53 ug/dL (ref 42–135)
Saturation Ratios: 35 % (ref 20–55)
TIBC: 151 ug/dL — ABNORMAL LOW (ref 250–470)
UIBC: 98 ug/dL — ABNORMAL LOW (ref 125–400)

## 2013-08-19 MED ORDER — EPOETIN ALFA 10000 UNIT/ML IJ SOLN
INTRAMUSCULAR | Status: AC
  Administered 2013-08-19: 10000 [IU] via SUBCUTANEOUS
  Filled 2013-08-19: qty 1

## 2013-08-19 MED ORDER — EPOETIN ALFA 10000 UNIT/ML IJ SOLN: 30000.0000 [IU] | INTRAMUSCULAR | Status: DC

## 2013-08-19 MED ORDER — EPOETIN ALFA 20000 UNIT/ML IJ SOLN
INTRAMUSCULAR | Status: AC
  Administered 2013-08-19: 20000 [IU] via SUBCUTANEOUS
  Filled 2013-08-19: qty 1

## 2013-08-20 LAB — FERRITIN: Ferritin: 550 ng/mL — ABNORMAL HIGH (ref 10–291)

## 2013-08-26 ENCOUNTER — Encounter (HOSPITAL_COMMUNITY)
Admission: RE | Admit: 2013-08-26 | Discharge: 2013-08-26 | Disposition: A | Discharge status: Home / Self Care | Payer: Medicare HMO | Source: Ambulatory Visit | Attending: Nephrology | Admitting: Nephrology

## 2013-08-26 DIAGNOSIS — D638 Anemia in other chronic diseases classified elsewhere: Secondary | ICD-10-CM | POA: Diagnosis not present

## 2013-08-26 LAB — POCT HEMOGLOBIN-HEMACUE: Hemoglobin: 10.3 g/dL — ABNORMAL LOW (ref 12.0–15.0)

## 2013-08-26 MED ORDER — EPOETIN ALFA 20000 UNIT/ML IJ SOLN
INTRAMUSCULAR | Status: AC
  Administered 2013-08-26: 20000 [IU] via SUBCUTANEOUS
  Filled 2013-08-26: qty 1

## 2013-08-26 MED ORDER — EPOETIN ALFA 10000 UNIT/ML IJ SOLN
INTRAMUSCULAR | Status: AC
  Administered 2013-08-26: 10000 [IU] via SUBCUTANEOUS
  Filled 2013-08-26: qty 1

## 2013-08-26 MED ORDER — EPOETIN ALFA 10000 UNIT/ML IJ SOLN: 30000.0000 [IU] | INTRAMUSCULAR | Status: DC

## 2013-09-02 ENCOUNTER — Encounter (HOSPITAL_COMMUNITY)
Admission: RE | Admit: 2013-09-02 | Discharge: 2013-09-02 | Disposition: A | Discharge status: Home / Self Care | Payer: Medicare HMO | Source: Ambulatory Visit | Attending: Nephrology | Admitting: Nephrology

## 2013-09-02 DIAGNOSIS — D638 Anemia in other chronic diseases classified elsewhere: Secondary | ICD-10-CM | POA: Diagnosis not present

## 2013-09-02 LAB — POCT HEMOGLOBIN-HEMACUE: Hemoglobin: 10.3 g/dL — ABNORMAL LOW (ref 12.0–15.0)

## 2013-09-02 MED ORDER — EPOETIN ALFA 20000 UNIT/ML IJ SOLN
INTRAMUSCULAR | Status: AC
  Administered 2013-09-02: 20000 [IU] via SUBCUTANEOUS
  Filled 2013-09-02: qty 1

## 2013-09-02 MED ORDER — EPOETIN ALFA 10000 UNIT/ML IJ SOLN
INTRAMUSCULAR | Status: AC
  Filled 2013-09-02: qty 1

## 2013-09-02 MED ORDER — EPOETIN ALFA 10000 UNIT/ML IJ SOLN
30000.0000 [IU] | INTRAMUSCULAR | Status: DC
  Administered 2013-09-02: 10000 [IU] via SUBCUTANEOUS

## 2013-09-09 ENCOUNTER — Encounter (HOSPITAL_COMMUNITY)
Admission: RE | Admit: 2013-09-09 | Discharge: 2013-09-09 | Disposition: A | Discharge status: Home / Self Care | Payer: Medicare HMO | Source: Ambulatory Visit | Attending: Nephrology | Admitting: Nephrology

## 2013-09-09 DIAGNOSIS — D638 Anemia in other chronic diseases classified elsewhere: Secondary | ICD-10-CM | POA: Insufficient documentation

## 2013-09-09 DIAGNOSIS — N183 Chronic kidney disease, stage 3 unspecified: Secondary | ICD-10-CM | POA: Diagnosis not present

## 2013-09-09 LAB — POCT HEMOGLOBIN-HEMACUE: Hemoglobin: 10.1 g/dL — ABNORMAL LOW (ref 12.0–15.0)

## 2013-09-09 MED ORDER — EPOETIN ALFA 20000 UNIT/ML IJ SOLN
INTRAMUSCULAR | Status: AC
  Administered 2013-09-09: 20000 [IU] via SUBCUTANEOUS
  Filled 2013-09-09: qty 1

## 2013-09-09 MED ORDER — EPOETIN ALFA 10000 UNIT/ML IJ SOLN
INTRAMUSCULAR | Status: AC
  Administered 2013-09-09: 10000 [IU] via SUBCUTANEOUS
  Filled 2013-09-09: qty 1

## 2013-09-09 MED ORDER — EPOETIN ALFA 10000 UNIT/ML IJ SOLN: 30000.0000 [IU] | INTRAMUSCULAR | Status: DC

## 2013-09-22 ENCOUNTER — Encounter (HOSPITAL_COMMUNITY)
Admission: RE | Admit: 2013-09-22 | Discharge: 2013-09-22 | Disposition: A | Discharge status: Home / Self Care | Payer: Medicare HMO | Source: Ambulatory Visit | Attending: Nephrology | Admitting: Nephrology

## 2013-09-22 DIAGNOSIS — D631 Anemia in chronic kidney disease: Secondary | ICD-10-CM | POA: Insufficient documentation

## 2013-09-22 DIAGNOSIS — N183 Chronic kidney disease, stage 3 unspecified: Secondary | ICD-10-CM | POA: Insufficient documentation

## 2013-09-22 DIAGNOSIS — N039 Chronic nephritic syndrome with unspecified morphologic changes: Principal | ICD-10-CM

## 2013-09-22 LAB — IRON AND TIBC
Iron: 139 ug/dL — ABNORMAL HIGH (ref 42–135)
Saturation Ratios: 90 % — ABNORMAL HIGH (ref 20–55)
TIBC: 154 ug/dL — AB (ref 250–470)
UIBC: 15 ug/dL — AB (ref 125–400)

## 2013-09-22 LAB — POCT HEMOGLOBIN-HEMACUE: HEMOGLOBIN: 10.2 g/dL — AB (ref 12.0–15.0)

## 2013-09-22 LAB — FERRITIN: FERRITIN: 501 ng/mL — AB (ref 10–291)

## 2013-09-22 MED ORDER — EPOETIN ALFA 10000 UNIT/ML IJ SOLN: 30000.0000 [IU] | INTRAMUSCULAR | Status: DC

## 2013-09-22 MED ORDER — EPOETIN ALFA 10000 UNIT/ML IJ SOLN
INTRAMUSCULAR | Status: AC
  Administered 2013-09-22: 10000 [IU] via SUBCUTANEOUS
  Filled 2013-09-22: qty 1

## 2013-09-22 MED ORDER — EPOETIN ALFA 20000 UNIT/ML IJ SOLN
INTRAMUSCULAR | Status: AC
  Administered 2013-09-22: 20000 [IU] via SUBCUTANEOUS
  Filled 2013-09-22: qty 1

## 2013-09-30 ENCOUNTER — Encounter (HOSPITAL_COMMUNITY)
Admission: RE | Admit: 2013-09-30 | Discharge: 2013-09-30 | Disposition: A | Discharge status: Home / Self Care | Payer: Medicare HMO | Source: Ambulatory Visit | Attending: Nephrology | Admitting: Nephrology

## 2013-09-30 DIAGNOSIS — D638 Anemia in other chronic diseases classified elsewhere: Secondary | ICD-10-CM | POA: Diagnosis not present

## 2013-09-30 LAB — POCT HEMOGLOBIN-HEMACUE: Hemoglobin: 10.4 g/dL — ABNORMAL LOW (ref 12.0–15.0)

## 2013-09-30 MED ORDER — EPOETIN ALFA 10000 UNIT/ML IJ SOLN
INTRAMUSCULAR | Status: AC
  Administered 2013-09-30: 10000 [IU] via SUBCUTANEOUS
  Filled 2013-09-30: qty 1

## 2013-09-30 MED ORDER — EPOETIN ALFA 20000 UNIT/ML IJ SOLN
INTRAMUSCULAR | Status: AC
  Administered 2013-09-30: 20000 [IU] via SUBCUTANEOUS
  Filled 2013-09-30: qty 1

## 2013-09-30 MED ORDER — EPOETIN ALFA 10000 UNIT/ML IJ SOLN: 30000.0000 [IU] | INTRAMUSCULAR | Status: DC

## 2013-10-10 ENCOUNTER — Other Ambulatory Visit: Payer: Self-pay | Admitting: *Deleted

## 2013-10-10 MED ORDER — FUROSEMIDE 20 MG PO TABS: 20.0000 mg | ORAL_TABLET | Freq: Every morning | ORAL | Status: DC

## 2013-10-10 MED ORDER — LISINOPRIL 20 MG PO TABS: ORAL_TABLET | ORAL | Status: DC

## 2013-10-21 ENCOUNTER — Encounter (HOSPITAL_COMMUNITY)
Admission: RE | Admit: 2013-10-21 | Discharge: 2013-10-21 | Disposition: A | Discharge status: Home / Self Care | Payer: Medicare HMO | Source: Ambulatory Visit | Attending: Nephrology | Admitting: Nephrology

## 2013-10-21 DIAGNOSIS — N183 Chronic kidney disease, stage 3 (moderate): Secondary | ICD-10-CM | POA: Insufficient documentation

## 2013-10-21 DIAGNOSIS — D631 Anemia in chronic kidney disease: Secondary | ICD-10-CM | POA: Insufficient documentation

## 2013-10-21 LAB — IRON AND TIBC
IRON: 122 ug/dL (ref 42–135)
Saturation Ratios: 81 % — ABNORMAL HIGH (ref 20–55)
TIBC: 151 ug/dL — ABNORMAL LOW (ref 250–470)
UIBC: 29 ug/dL — ABNORMAL LOW (ref 125–400)

## 2013-10-21 LAB — POCT HEMOGLOBIN-HEMACUE: Hemoglobin: 8.5 g/dL — ABNORMAL LOW (ref 12.0–15.0)

## 2013-10-21 LAB — FERRITIN: Ferritin: 723 ng/mL — ABNORMAL HIGH (ref 10–291)

## 2013-10-21 MED ORDER — EPOETIN ALFA 20000 UNIT/ML IJ SOLN
INTRAMUSCULAR | Status: AC
  Administered 2013-10-21: 20000 [IU] via SUBCUTANEOUS
  Filled 2013-10-21: qty 1

## 2013-10-21 MED ORDER — EPOETIN ALFA 10000 UNIT/ML IJ SOLN: 30000.0000 [IU] | INTRAMUSCULAR | Status: DC

## 2013-10-21 MED ORDER — EPOETIN ALFA 10000 UNIT/ML IJ SOLN
INTRAMUSCULAR | Status: AC
  Administered 2013-10-21: 10000 [IU] via SUBCUTANEOUS
  Filled 2013-10-21: qty 1

## 2013-10-28 ENCOUNTER — Encounter (HOSPITAL_COMMUNITY): Payer: Medicare HMO

## 2013-11-04 ENCOUNTER — Ambulatory Visit (INDEPENDENT_AMBULATORY_CARE_PROVIDER_SITE_OTHER): Payer: Medicare HMO | Admitting: Cardiology

## 2013-11-04 ENCOUNTER — Encounter: Payer: Self-pay | Admitting: Cardiology

## 2013-11-04 VITALS — BP 142/52 | HR 68 | Ht 62.0 in | Wt 172.7 lb

## 2013-11-04 DIAGNOSIS — K7460 Unspecified cirrhosis of liver: Secondary | ICD-10-CM

## 2013-11-04 DIAGNOSIS — N183 Chronic kidney disease, stage 3 unspecified: Secondary | ICD-10-CM

## 2013-11-04 DIAGNOSIS — I350 Nonrheumatic aortic (valve) stenosis: Secondary | ICD-10-CM

## 2013-11-04 DIAGNOSIS — R609 Edema, unspecified: Secondary | ICD-10-CM

## 2013-11-04 DIAGNOSIS — I5032 Chronic diastolic (congestive) heart failure: Secondary | ICD-10-CM

## 2013-11-04 NOTE — Patient Instructions (Signed)
Continue your current therapy  Eliminate sodium in your diet.  I will see you in 6 months.

## 2013-11-04 NOTE — Progress Notes (Signed)
Jennifer Neal Date of Birth: [birth year] Medical Record #[id number]  History of Present Illness: Ms. Jennifer Neal is seen today for a follow up visit.  She is here with her daughter Jennifer Neal [phone]). She has a history of severe aortic stenosis. She also has a history of hepatic cirrhosis, and anemia-on chronic Procrit injections.She has CKD and  HTN.  She still complains of swelling in her legs. It is difficult to get her shoes on. Doesn't get a great response to diuretics in am but urinates more at night when feet are elevated.  No chest pain or SOB. Most recent Hbg 8.5. Renal parameters followed by Dr. Randa Neal.  Current Outpatient Prescriptions on File Prior to Visit  Medication Sig Dispense Refill  . amLODipine (NORVASC) 5 MG tablet 1/2 tab po qd      . aspirin 81 MG EC tablet Take 81 mg by mouth at bedtime.       Marland Kitchen epoetin alfa (EPOGEN,PROCRIT) 77939 UNIT/ML injection Inject 20,000 Units into the skin every Monday.       . ferrous sulfate 325 (65 FE) MG tablet Take 325 mg by mouth 2 (two) times daily with a meal.      . furosemide (LASIX) 20 MG tablet Take 1 tablet (20 mg total) by mouth every morning.  90 tablet  0  . lisinopril (PRINIVIL,ZESTRIL) 20 MG tablet TAKE 1 TABLET BY MOUTH TWICE A DAY  180 tablet  0  . spironolactone (ALDACTONE) 25 MG tablet Take 100 mg by mouth every morning.       . trolamine salicylate (ASPERCREME) 10 % cream Apply 1 application topically as needed for muscle pain.       No current facility-administered medications on file prior to visit.    Allergies  Allergen Reactions  . Codeine   . Penicillins   . Sulfa Drugs Cross Reactors     Past Medical History  Diagnosis Date  . Hypertension   . Aortic stenosis, moderate     moderate to severe  . Rheumatoid arthritis(714.0)   . Asthma   . Hepatic cirrhosis   . Anemia   . CKD (chronic kidney disease), stage III   . PNA (pneumonia) Dec 2012  . Diastolic CHF, chronic   . Abnormal echocardiogram  Dec 2012    mild LVH, normal systolic function, EF 60 to 65%, grade 2 diastolic dysfunction, LAE, moderate AS and mild to moderate pulmonary HTN  . Resting tremor     Past Surgical History  Procedure Laterality Date  . Abdominal hysterectomy    . Breast lumpectomy      right    History  Smoking status  . Never Smoker   Smokeless tobacco  . Not on file    History  Alcohol Use No    Family History  Problem Relation Age of Onset  . Heart attack Brother   . Cancer Brother   . Cancer Brother   . Aortic aneurysm Brother   . Cancer Sister   . Cancer Sister   . Cancer Sister   . Goiter Mother     Review of Systems: The review of systems is per the HPI.  All other systems were reviewed and are negative.  Physical Exam: BP 142/52  Pulse 68  Ht 5\' 2"  (1.575 m)  Wt 172 lb 11.2 oz (78.336 kg)  BMI 31.58 kg/m2 Patient is an elderly overweight female who looks chronically ill but in no acute distress.She does have a tremor. Skin is  warm and dry.  Color is normal.  HEENT is unremarkable. Normocephalic/atraumatic. PERRL. Sclera are nonicteric. Neck is supple. No masses. No JVD. Lungs are clear. Cardiac exam shows a regular rate and rhythm. She has a harsh 3/6 aortic outlfow murmur.  Abdomen is obese but soft. Extremities are full with 1-2+ edema. No signs of cellulitis. No tenderness to palpation.  No gross neurologic deficits noted.   LABORATORY DATA:   Lab Results  Component Value Date   WBC 6.1 03/06/2013   HGB 8.5* 10/21/2013   HCT 28.8* 03/06/2013   PLT 117* 03/06/2013   GLUCOSE 112* 03/06/2013   ALT 16 03/06/2013   AST 21 03/06/2013   NA 143 03/06/2013   K 4.8 03/06/2013   CL 110 03/06/2013   CREATININE 1.56* 03/06/2013   BUN 17 03/06/2013   CO2 25 03/06/2013    Assessment / Plan: 1. Edema- Multifactorial -most likely related to cirrhosis with ascites. Also has CKD and diastolic CHF.  Recommend knee high support stockings as much as possible. Strict Sodium restriction.  Continue current diuretic Rx with aldactone and lasix.  2. Aortic stenosis severe. Last Echo shows mild progression since 2012 with mean gradient increase from 33>>40 mm Hg. I don't think she is significantly symptomatic. She is not a candidate for open AVR. I think with her multiple co-morbidites including cirrhosis she is also a poor candidate for TAVR. Will follow clinically.  3. Cirrhosis. Followed by Dr. Randa Neal.  4. Anemia of chronic disease. On Procrit.  5. CKD stage 3  6. RA  I think she is treading water at this point which is about the best we can hope for. I will follow up in 6 months.

## 2013-11-05 ENCOUNTER — Other Ambulatory Visit: Payer: Self-pay | Admitting: Nurse Practitioner

## 2013-11-11 ENCOUNTER — Encounter (HOSPITAL_COMMUNITY)
Admission: RE | Admit: 2013-11-11 | Discharge: 2013-11-11 | Disposition: A | Discharge status: Home / Self Care | Payer: Medicare HMO | Source: Ambulatory Visit | Attending: Nephrology | Admitting: Nephrology

## 2013-11-11 DIAGNOSIS — N183 Chronic kidney disease, stage 3 (moderate): Secondary | ICD-10-CM | POA: Diagnosis not present

## 2013-11-11 DIAGNOSIS — D631 Anemia in chronic kidney disease: Secondary | ICD-10-CM | POA: Insufficient documentation

## 2013-11-11 LAB — POCT HEMOGLOBIN-HEMACUE: Hemoglobin: 7.9 g/dL — ABNORMAL LOW (ref 12.0–15.0)

## 2013-11-11 MED ORDER — EPOETIN ALFA 20000 UNIT/ML IJ SOLN
INTRAMUSCULAR | Status: AC
  Administered 2013-11-11: 20000 [IU] via SUBCUTANEOUS
  Filled 2013-11-11: qty 1

## 2013-11-11 MED ORDER — EPOETIN ALFA 10000 UNIT/ML IJ SOLN
INTRAMUSCULAR | Status: AC
  Administered 2013-11-11: 10000 [IU] via SUBCUTANEOUS
  Filled 2013-11-11: qty 1

## 2013-11-11 MED ORDER — EPOETIN ALFA 10000 UNIT/ML IJ SOLN: 30000.0000 [IU] | INTRAMUSCULAR | Status: DC

## 2013-11-11 NOTE — Progress Notes (Signed)
Hemocue 7.9.Crystal at [facility] advised  Patient denies signs of bleeding, shortness of breath or increased weakness.  Injections are ordered weekly and patient has missed 2 weeks.  MD advised per Crystal.   No change in orders.  Patient advised to try to keep weekly appointments

## 2013-11-18 ENCOUNTER — Encounter (HOSPITAL_COMMUNITY)
Admission: RE | Admit: 2013-11-18 | Discharge: 2013-11-18 | Disposition: A | Discharge status: Home / Self Care | Payer: Medicare HMO | Source: Ambulatory Visit | Attending: Nephrology | Admitting: Nephrology

## 2013-11-18 DIAGNOSIS — N183 Chronic kidney disease, stage 3 (moderate): Secondary | ICD-10-CM | POA: Diagnosis not present

## 2013-11-18 LAB — POCT HEMOGLOBIN-HEMACUE: Hemoglobin: 8.4 g/dL — ABNORMAL LOW (ref 12.0–15.0)

## 2013-11-18 LAB — IRON AND TIBC
Iron: 37 ug/dL — ABNORMAL LOW (ref 42–135)
Saturation Ratios: 25 % (ref 20–55)
TIBC: 148 ug/dL — AB (ref 250–470)
UIBC: 111 ug/dL — AB (ref 125–400)

## 2013-11-18 LAB — FERRITIN: FERRITIN: 474 ng/mL — AB (ref 10–291)

## 2013-11-18 MED ORDER — EPOETIN ALFA 10000 UNIT/ML IJ SOLN
30000.0000 [IU] | INTRAMUSCULAR | Status: DC
Start: 2013-11-18 — End: 2013-11-19

## 2013-11-18 MED ORDER — EPOETIN ALFA 10000 UNIT/ML IJ SOLN
INTRAMUSCULAR | Status: AC
  Administered 2013-11-18: 10000 [IU] via SUBCUTANEOUS
  Filled 2013-11-18: qty 1

## 2013-11-18 MED ORDER — EPOETIN ALFA 20000 UNIT/ML IJ SOLN
INTRAMUSCULAR | Status: AC
  Administered 2013-11-18: 20000 [IU] via SUBCUTANEOUS
  Filled 2013-11-18: qty 1

## 2013-11-25 ENCOUNTER — Telehealth: Payer: Self-pay | Admitting: Cardiology

## 2013-11-25 ENCOUNTER — Encounter (HOSPITAL_COMMUNITY): Payer: Medicare HMO

## 2013-11-25 NOTE — Telephone Encounter (Signed)
Returning call to patient's daughter [name].She stated mother saw PCP yesterday 11/24/13 for sob,coughing.Stated cxr revealed pneumonia and chf.O2 sat 85 to 91 %. Stated prednisone,antibiotics,cough medication prescribed.Stated she was told to see Dr.Jordan.Appointment scheduled with Dr.Jordan 11/28/13 at 2:00 pm.Advised to go to [facility] ER if sob gets worse.

## 2013-11-25 NOTE — Telephone Encounter (Signed)
Please call asap,mother went to primary care care doctor yesterday. He took xrays,they thinks she is in congestive heart failure.

## 2013-11-26 ENCOUNTER — Telehealth: Payer: Self-pay

## 2013-11-26 MED ORDER — FUROSEMIDE 20 MG PO TABS: ORAL_TABLET | ORAL | Status: DC

## 2013-11-26 NOTE — Telephone Encounter (Signed)
Received a call from patient's daughter she stated mother's does have increased swelling in feet.Stated she requested recent cxr to be faxed to Dr.Jordan to have at her appointment 11/28/13.Spoke to Dr.Jordan he advised increase Lasix to 40 mg daily.

## 2013-11-28 ENCOUNTER — Ambulatory Visit (INDEPENDENT_AMBULATORY_CARE_PROVIDER_SITE_OTHER): Payer: Medicare HMO | Admitting: Cardiology

## 2013-11-28 ENCOUNTER — Encounter: Payer: Self-pay | Admitting: Cardiology

## 2013-11-28 VITALS — BP 124/60 | HR 62 | Ht 62.0 in | Wt 172.1 lb

## 2013-11-28 DIAGNOSIS — N183 Chronic kidney disease, stage 3 unspecified: Secondary | ICD-10-CM

## 2013-11-28 DIAGNOSIS — R609 Edema, unspecified: Secondary | ICD-10-CM

## 2013-11-28 DIAGNOSIS — I350 Nonrheumatic aortic (valve) stenosis: Secondary | ICD-10-CM

## 2013-11-28 DIAGNOSIS — K7460 Unspecified cirrhosis of liver: Secondary | ICD-10-CM

## 2013-11-28 DIAGNOSIS — I5033 Acute on chronic diastolic (congestive) heart failure: Secondary | ICD-10-CM

## 2013-11-28 NOTE — Patient Instructions (Signed)
Continue lasix 40 mg daily  We will check blood work today  [employer] course of antibiotics and steroids  I will see you in 3 months.

## 2013-11-28 NOTE — Progress Notes (Signed)
Jennifer Neal Date of Birth: [birth year] Medical Record #[id number]  History of Present Illness: Jennifer Neal is seen as a work in for CHF.  She is here with her daughter Jennifer Neal [phone]). She has a history of severe aortic stenosis. She also has a history of hepatic cirrhosis, and anemia-on chronic Procrit injections.She has CKD and  HTN.  She has diastolic CHF. On Monday she complained of acute SOB. She had a cough productive of thick yellow sputum. She was seen by PCP and started on prednisone, antibiotics,  And cough medication. The following day her lasix dose was increased to 40 mg daily for increased swelling. Today she is better. Cough resolving and breathing is improved. CXR done at PCP showed no change compared to Feb.   Current Outpatient Prescriptions on File Prior to Visit  Medication Sig Dispense Refill  . amLODipine (NORVASC) 5 MG tablet 1/2 tab po qd    . aspirin 81 MG EC tablet Take 81 mg by mouth at bedtime.     Marland Kitchen epoetin alfa (EPOGEN,PROCRIT) 06269 UNIT/ML injection Inject 20,000 Units into the skin every Monday.     . ferrous sulfate 325 (65 FE) MG tablet Take 325 mg by mouth 2 (two) times daily with a meal.    . furosemide (LASIX) 20 MG tablet Take 2 tablets ( 40 mg ) daily 60 tablet 6  . lisinopril (PRINIVIL,ZESTRIL) 20 MG tablet TAKE 1 TABLET BY MOUTH TWICE A DAY 180 tablet 0  . spironolactone (ALDACTONE) 25 MG tablet Take 100 mg by mouth every morning.     . trolamine salicylate (ASPERCREME) 10 % cream Apply 1 application topically as needed for muscle pain.     No current facility-administered medications on file prior to visit.    Allergies  Allergen Reactions  . Codeine   . Penicillins   . Sulfa Drugs Cross Reactors     Past Medical History  Diagnosis Date  . Hypertension   . Aortic stenosis, moderate     moderate to severe  . Rheumatoid arthritis(714.0)   . Asthma   . Hepatic cirrhosis   . Anemia   . CKD (chronic kidney disease), stage III     . PNA (pneumonia) Dec 2012  . Diastolic CHF, chronic   . Abnormal echocardiogram Dec 2012    mild LVH, normal systolic function, EF 60 to 65%, grade 2 diastolic dysfunction, LAE, moderate AS and mild to moderate pulmonary HTN  . Resting tremor     Past Surgical History  Procedure Laterality Date  . Abdominal hysterectomy    . Breast lumpectomy      right    History  Smoking status  . Never Smoker   Smokeless tobacco  . Not on file    History  Alcohol Use No    Family History  Problem Relation Age of Onset  . Heart attack Brother   . Cancer Brother   . Cancer Brother   . Aortic aneurysm Brother   . Cancer Sister   . Cancer Sister   . Cancer Sister   . Goiter Mother     Review of Systems: The review of systems is per the HPI.  All other systems were reviewed and are negative.  Physical Exam: BP 124/60 mmHg  Pulse 62  Ht 5\' 2"  (1.575 m)  Wt 172 lb 1.6 oz (78.064 kg)  BMI 31.47 kg/m2 Patient is an elderly overweight female who looks chronically ill but in no acute distress.She does have a  tremor. Skin is  warm and dry.  Color is normal.  HEENT is unremarkable. Normocephalic/atraumatic. PERRL. Sclera are nonicteric. Neck is supple. No masses. No JVD. Lungs are clear. Cardiac exam shows a regular rate and rhythm. She has a harsh 3/6 aortic outlfow murmur.  Abdomen is obese but soft. Extremities are full with2+ edema. No signs of cellulitis. No tenderness to palpation.  No gross neurologic deficits noted.   LABORATORY DATA:   Lab Results  Component Value Date   WBC 6.1 03/06/2013   HGB 8.4* 11/18/2013   HCT 28.8* 03/06/2013   PLT 117* 03/06/2013   GLUCOSE 112* 03/06/2013   ALT 16 03/06/2013   AST 21 03/06/2013   NA 143 03/06/2013   K 4.8 03/06/2013   CL 110 03/06/2013   CREATININE 1.56* 03/06/2013   BUN 17 03/06/2013   CO2 25 03/06/2013   CXR report reviewed.  Ecg today shows NSR with normal Ecg. I have personally reviewed and interpreted this  study.   Assessment / Plan: 1. Acute dyspnea related to acute bronchitis and acute on chronic diastolic CHF. Improved with above therapy. Will continue lasix at 40 mg daily.   Recommend knee high support stockings as much as possible. Strict Sodium restriction. Will check CMET and BNP today.  2. Aortic stenosis severe. Last Echo shows mild progression since 2012 with mean gradient increase from 33>>40 mm Hg.  She is not a candidate for open AVR. I think with her multiple co-morbidites including cirrhosis she is also a poor candidate for TAVR. Will follow clinically.  3. Cirrhosis. Followed by Dr. Randa Evens.  4. Anemia of chronic disease. On Procrit.  5. CKD stage 3  6. RA

## 2013-12-01 ENCOUNTER — Telehealth: Payer: Self-pay | Admitting: Cardiology

## 2013-12-01 DIAGNOSIS — I5032 Chronic diastolic (congestive) heart failure: Secondary | ICD-10-CM

## 2013-12-01 DIAGNOSIS — R0602 Shortness of breath: Secondary | ICD-10-CM

## 2013-12-01 DIAGNOSIS — I350 Nonrheumatic aortic (valve) stenosis: Secondary | ICD-10-CM

## 2013-12-01 NOTE — Telephone Encounter (Signed)
Please call,concerning her mother lab work.  She wants to know if she can get her lab work at the hospital tomorrow?s ye

## 2013-12-01 NOTE — Telephone Encounter (Signed)
Returned call to patient's daughter Jennifer Neal she stated [facility] lab unable to draw blood last Friday.Stated she was told to come back this week.Stated patient will be going to [facility] tomorrow 11/22/13 and could she have done there.Advised that will be ok,order for cmet,bnp entered in for [facility] to do lab work.

## 2013-12-02 ENCOUNTER — Encounter (HOSPITAL_COMMUNITY)
Admission: RE | Admit: 2013-12-02 | Discharge: 2013-12-02 | Disposition: A | Discharge status: Home / Self Care | Payer: Medicare HMO | Source: Ambulatory Visit | Attending: Nephrology | Admitting: Nephrology

## 2013-12-02 ENCOUNTER — Encounter: Payer: Self-pay | Admitting: Cardiology

## 2013-12-02 DIAGNOSIS — N183 Chronic kidney disease, stage 3 (moderate): Secondary | ICD-10-CM | POA: Diagnosis not present

## 2013-12-02 LAB — COMPREHENSIVE METABOLIC PANEL
ALBUMIN: 2.8 g/dL — AB (ref 3.5–5.2)
ALK PHOS: 65 U/L (ref 39–117)
ALT: 26 U/L (ref 0–35)
AST: 26 U/L (ref 0–37)
BUN: 35 mg/dL — AB (ref 6–23)
CALCIUM: 7.8 mg/dL — AB (ref 8.4–10.5)
CHLORIDE: 104 meq/L (ref 96–112)
CO2: 26 mEq/L (ref 19–32)
CREATININE: 1.98 mg/dL — AB (ref 0.50–1.10)
Glucose, Bld: 117 mg/dL — ABNORMAL HIGH (ref 70–99)
Potassium: 5 mEq/L (ref 3.5–5.3)
Sodium: 136 mEq/L (ref 135–145)
Total Bilirubin: 1.7 mg/dL — ABNORMAL HIGH (ref 0.2–1.2)
Total Protein: 5 g/dL — ABNORMAL LOW (ref 6.0–8.3)

## 2013-12-02 LAB — POCT HEMOGLOBIN-HEMACUE: HEMOGLOBIN: 9.2 g/dL — AB (ref 12.0–15.0)

## 2013-12-02 MED ORDER — EPOETIN ALFA 10000 UNIT/ML IJ SOLN
30000.0000 [IU] | INTRAMUSCULAR | Status: DC
Start: 2013-12-02 — End: 2013-12-03

## 2013-12-02 MED ORDER — EPOETIN ALFA 20000 UNIT/ML IJ SOLN
INTRAMUSCULAR | Status: AC
  Administered 2013-12-02: 20000 [IU] via SUBCUTANEOUS
  Filled 2013-12-02: qty 1

## 2013-12-02 MED ORDER — EPOETIN ALFA 10000 UNIT/ML IJ SOLN
INTRAMUSCULAR | Status: AC
  Administered 2013-12-02: 10000 [IU] via SUBCUTANEOUS
  Filled 2013-12-02: qty 1

## 2013-12-04 LAB — PRO B NATRIURETIC PEPTIDE: Pro B Natriuretic peptide (BNP): 1821 pg/mL — ABNORMAL HIGH (ref <451)

## 2013-12-08 ENCOUNTER — Telehealth: Payer: Self-pay | Admitting: Cardiology

## 2013-12-08 NOTE — Telephone Encounter (Signed)
Jennifer Neal is returning Cheryl's call in regards to her mother's test results. Please call  Thanks

## 2013-12-08 NOTE — Telephone Encounter (Signed)
Returned call to patient's daughter [name] recent lab results given.

## 2013-12-11 ENCOUNTER — Encounter (HOSPITAL_COMMUNITY)
Admission: RE | Admit: 2013-12-11 | Discharge: 2013-12-11 | Disposition: A | Discharge status: Home / Self Care | Payer: Medicare HMO | Source: Ambulatory Visit | Attending: Nephrology | Admitting: Nephrology

## 2013-12-11 DIAGNOSIS — D631 Anemia in chronic kidney disease: Secondary | ICD-10-CM | POA: Diagnosis not present

## 2013-12-11 DIAGNOSIS — N183 Chronic kidney disease, stage 3 (moderate): Secondary | ICD-10-CM | POA: Diagnosis not present

## 2013-12-11 LAB — POCT HEMOGLOBIN-HEMACUE: HEMOGLOBIN: 9 g/dL — AB (ref 12.0–15.0)

## 2013-12-11 MED ORDER — EPOETIN ALFA 10000 UNIT/ML IJ SOLN
INTRAMUSCULAR | Status: AC
  Administered 2013-12-11: 10000 [IU] via SUBCUTANEOUS
  Filled 2013-12-11: qty 1

## 2013-12-11 MED ORDER — EPOETIN ALFA 20000 UNIT/ML IJ SOLN
INTRAMUSCULAR | Status: AC
  Administered 2013-12-11: 20000 [IU] via SUBCUTANEOUS
  Filled 2013-12-11: qty 1

## 2013-12-11 MED ORDER — EPOETIN ALFA 10000 UNIT/ML IJ SOLN: 40000.0000 [IU] | INTRAMUSCULAR | Status: DC

## 2013-12-23 ENCOUNTER — Encounter (HOSPITAL_COMMUNITY)
Admission: RE | Admit: 2013-12-23 | Discharge: 2013-12-23 | Disposition: A | Discharge status: Home / Self Care | Payer: Medicare HMO | Source: Ambulatory Visit | Attending: Nephrology | Admitting: Nephrology

## 2013-12-23 DIAGNOSIS — D631 Anemia in chronic kidney disease: Secondary | ICD-10-CM | POA: Diagnosis not present

## 2013-12-23 LAB — IRON AND TIBC
Iron: 94 ug/dL (ref 42–135)
SATURATION RATIOS: 65 % — AB (ref 20–55)
TIBC: 145 ug/dL — ABNORMAL LOW (ref 250–470)
UIBC: 51 ug/dL — ABNORMAL LOW (ref 125–400)

## 2013-12-23 LAB — FERRITIN: Ferritin: 659 ng/mL — ABNORMAL HIGH (ref 10–291)

## 2013-12-23 LAB — POCT HEMOGLOBIN-HEMACUE: HEMOGLOBIN: 8.4 g/dL — AB (ref 12.0–15.0)

## 2013-12-23 MED ORDER — EPOETIN ALFA 10000 UNIT/ML IJ SOLN
INTRAMUSCULAR | Status: AC
  Administered 2013-12-23: 10000 [IU] via SUBCUTANEOUS
  Filled 2013-12-23: qty 1

## 2013-12-23 MED ORDER — EPOETIN ALFA 10000 UNIT/ML IJ SOLN: 40000.0000 [IU] | INTRAMUSCULAR | Status: DC

## 2013-12-23 MED ORDER — EPOETIN ALFA 20000 UNIT/ML IJ SOLN
INTRAMUSCULAR | Status: AC
  Administered 2013-12-23: 20000 [IU] via SUBCUTANEOUS
  Filled 2013-12-23: qty 1

## 2013-12-30 ENCOUNTER — Encounter (HOSPITAL_COMMUNITY)
Admission: RE | Admit: 2013-12-30 | Discharge: 2013-12-30 | Disposition: A | Discharge status: Home / Self Care | Payer: Medicare HMO | Source: Ambulatory Visit | Attending: Nephrology | Admitting: Nephrology

## 2013-12-30 DIAGNOSIS — D631 Anemia in chronic kidney disease: Secondary | ICD-10-CM | POA: Diagnosis not present

## 2013-12-30 LAB — POCT HEMOGLOBIN-HEMACUE: HEMOGLOBIN: 10.4 g/dL — AB (ref 12.0–15.0)

## 2013-12-30 MED ORDER — EPOETIN ALFA 40000 UNIT/ML IJ SOLN
INTRAMUSCULAR | Status: AC
  Administered 2013-12-30: 40000 [IU] via SUBCUTANEOUS
  Filled 2013-12-30: qty 1

## 2013-12-30 MED ORDER — EPOETIN ALFA 10000 UNIT/ML IJ SOLN: 40000.0000 [IU] | INTRAMUSCULAR | Status: DC

## 2014-01-06 ENCOUNTER — Encounter (HOSPITAL_COMMUNITY)
Admission: RE | Admit: 2014-01-06 | Discharge: 2014-01-06 | Disposition: A | Discharge status: Home / Self Care | Payer: Medicare HMO | Source: Ambulatory Visit | Attending: Nephrology | Admitting: Nephrology

## 2014-01-06 DIAGNOSIS — D631 Anemia in chronic kidney disease: Secondary | ICD-10-CM | POA: Diagnosis not present

## 2014-01-06 LAB — POCT HEMOGLOBIN-HEMACUE: HEMOGLOBIN: 9.9 g/dL — AB (ref 12.0–15.0)

## 2014-01-06 MED ORDER — EPOETIN ALFA 10000 UNIT/ML IJ SOLN: 40000.0000 [IU] | INTRAMUSCULAR | Status: DC

## 2014-01-06 MED ORDER — EPOETIN ALFA 40000 UNIT/ML IJ SOLN
INTRAMUSCULAR | Status: AC
  Administered 2014-01-06: 40000 [IU] via SUBCUTANEOUS
  Filled 2014-01-06: qty 1

## 2014-01-13 ENCOUNTER — Encounter (HOSPITAL_COMMUNITY)
Admission: RE | Admit: 2014-01-13 | Discharge: 2014-01-13 | Disposition: A | Discharge status: Home / Self Care | Payer: Medicare Other | Source: Ambulatory Visit | Attending: Nephrology | Admitting: Nephrology

## 2014-01-13 ENCOUNTER — Telehealth: Payer: Self-pay | Admitting: Cardiology

## 2014-01-13 ENCOUNTER — Other Ambulatory Visit: Payer: Self-pay | Admitting: *Deleted

## 2014-01-13 DIAGNOSIS — D631 Anemia in chronic kidney disease: Secondary | ICD-10-CM | POA: Diagnosis present

## 2014-01-13 DIAGNOSIS — N183 Chronic kidney disease, stage 3 (moderate): Secondary | ICD-10-CM | POA: Diagnosis not present

## 2014-01-13 LAB — POCT HEMOGLOBIN-HEMACUE: Hemoglobin: 10.8 g/dL — ABNORMAL LOW (ref 12.0–15.0)

## 2014-01-13 MED ORDER — EPOETIN ALFA 10000 UNIT/ML IJ SOLN: 40000.0000 [IU] | INTRAMUSCULAR | Status: DC

## 2014-01-13 MED ORDER — LISINOPRIL 20 MG PO TABS: ORAL_TABLET | ORAL | Status: DC

## 2014-01-13 MED ORDER — EPOETIN ALFA 40000 UNIT/ML IJ SOLN
INTRAMUSCULAR | Status: AC
  Administered 2014-01-13: 40000 [IU] via SUBCUTANEOUS
  Filled 2014-01-13: qty 1

## 2014-01-13 NOTE — Telephone Encounter (Signed)
Pt need a new prescription for Lisinopril #90 and refills. Please call to [facility].

## 2014-01-20 ENCOUNTER — Encounter (HOSPITAL_COMMUNITY): Payer: Medicare HMO

## 2014-01-26 ENCOUNTER — Encounter (HOSPITAL_COMMUNITY): Payer: Medicare HMO

## 2014-01-27 ENCOUNTER — Encounter (HOSPITAL_COMMUNITY)
Admission: RE | Admit: 2014-01-27 | Discharge: 2014-01-27 | Disposition: A | Discharge status: Home / Self Care | Payer: Medicare Other | Source: Ambulatory Visit | Attending: Nephrology | Admitting: Nephrology

## 2014-01-27 DIAGNOSIS — D631 Anemia in chronic kidney disease: Secondary | ICD-10-CM | POA: Diagnosis not present

## 2014-01-27 LAB — IRON AND TIBC
Iron: 35 ug/dL — ABNORMAL LOW (ref 42–145)
SATURATION RATIOS: 31 % (ref 20–55)
TIBC: 113 ug/dL — AB (ref 250–470)
UIBC: 78 ug/dL — AB (ref 125–400)

## 2014-01-27 LAB — FERRITIN: Ferritin: 702 ng/mL — ABNORMAL HIGH (ref 10–291)

## 2014-01-27 MED ORDER — EPOETIN ALFA 40000 UNIT/ML IJ SOLN
INTRAMUSCULAR | Status: AC
  Administered 2014-01-27: 40000 [IU]
  Filled 2014-01-27: qty 1

## 2014-01-27 MED ORDER — EPOETIN ALFA 10000 UNIT/ML IJ SOLN: 40000.0000 [IU] | INTRAMUSCULAR | Status: DC

## 2014-01-28 LAB — POCT HEMOGLOBIN-HEMACUE: HEMOGLOBIN: 10.1 g/dL — AB (ref 12.0–15.0)

## 2014-02-05 ENCOUNTER — Encounter (HOSPITAL_COMMUNITY): Payer: Medicare HMO

## 2014-02-10 ENCOUNTER — Encounter (HOSPITAL_COMMUNITY)
Admission: RE | Admit: 2014-02-10 | Discharge: 2014-02-10 | Disposition: A | Discharge status: Home / Self Care | Payer: Medicare Other | Source: Ambulatory Visit | Attending: Nephrology | Admitting: Nephrology

## 2014-02-10 DIAGNOSIS — N183 Chronic kidney disease, stage 3 (moderate): Secondary | ICD-10-CM | POA: Diagnosis not present

## 2014-02-10 DIAGNOSIS — D631 Anemia in chronic kidney disease: Secondary | ICD-10-CM | POA: Diagnosis not present

## 2014-02-10 LAB — POCT HEMOGLOBIN-HEMACUE: Hemoglobin: 9.5 g/dL — ABNORMAL LOW (ref 12.0–15.0)

## 2014-02-10 MED ORDER — EPOETIN ALFA 10000 UNIT/ML IJ SOLN: 40000.0000 [IU] | INTRAMUSCULAR | Status: DC

## 2014-02-10 MED ORDER — EPOETIN ALFA 40000 UNIT/ML IJ SOLN
INTRAMUSCULAR | Status: AC
  Administered 2014-02-10: 40000 [IU] via SUBCUTANEOUS
  Filled 2014-02-10: qty 1

## 2014-02-16 ENCOUNTER — Encounter (HOSPITAL_COMMUNITY)
Admission: RE | Admit: 2014-02-16 | Discharge: 2014-02-16 | Disposition: A | Discharge status: Home / Self Care | Payer: Medicare Other | Source: Ambulatory Visit | Attending: Nephrology | Admitting: Nephrology

## 2014-02-16 DIAGNOSIS — D631 Anemia in chronic kidney disease: Secondary | ICD-10-CM | POA: Diagnosis not present

## 2014-02-16 MED ORDER — EPOETIN ALFA 40000 UNIT/ML IJ SOLN
INTRAMUSCULAR | Status: AC
  Filled 2014-02-16: qty 1

## 2014-02-16 MED ORDER — EPOETIN ALFA 10000 UNIT/ML IJ SOLN
40000.0000 [IU] | INTRAMUSCULAR | Status: DC
  Administered 2014-02-16: 40000 [IU] via SUBCUTANEOUS

## 2014-02-17 LAB — POCT HEMOGLOBIN-HEMACUE: Hemoglobin: 9.4 g/dL — ABNORMAL LOW (ref 12.0–15.0)

## 2014-02-17 MED FILL — Epoetin Alfa Inj 40000 Unit/ML: INTRAMUSCULAR | Qty: 1 | Status: AC

## 2014-03-02 ENCOUNTER — Encounter (HOSPITAL_COMMUNITY)
Admission: RE | Admit: 2014-03-02 | Discharge: 2014-03-02 | Disposition: A | Discharge status: Home / Self Care | Payer: Medicare Other | Source: Ambulatory Visit | Attending: Nephrology | Admitting: Nephrology

## 2014-03-02 DIAGNOSIS — D631 Anemia in chronic kidney disease: Secondary | ICD-10-CM | POA: Diagnosis not present

## 2014-03-02 LAB — IRON AND TIBC
Iron: 123 ug/dL (ref 42–145)
UIBC: <15 ug/dL — ABNORMAL LOW (ref 125–400)

## 2014-03-02 LAB — FERRITIN: FERRITIN: 755 ng/mL — AB (ref 10–291)

## 2014-03-02 LAB — POCT HEMOGLOBIN-HEMACUE: HEMOGLOBIN: 10 g/dL — AB (ref 12.0–15.0)

## 2014-03-02 MED ORDER — EPOETIN ALFA 10000 UNIT/ML IJ SOLN: 40000.0000 [IU] | INTRAMUSCULAR | Status: DC

## 2014-03-02 MED ORDER — EPOETIN ALFA 40000 UNIT/ML IJ SOLN
INTRAMUSCULAR | Status: AC
  Administered 2014-03-02: 40000 [IU] via SUBCUTANEOUS
  Filled 2014-03-02: qty 1

## 2014-03-09 ENCOUNTER — Other Ambulatory Visit (HOSPITAL_COMMUNITY): Payer: Self-pay | Admitting: *Deleted

## 2014-03-10 ENCOUNTER — Encounter (HOSPITAL_COMMUNITY)
Admission: RE | Admit: 2014-03-10 | Discharge: 2014-03-10 | Disposition: A | Discharge status: Home / Self Care | Payer: Medicare Other | Source: Ambulatory Visit | Attending: Nephrology | Admitting: Nephrology

## 2014-03-10 ENCOUNTER — Encounter (HOSPITAL_COMMUNITY): Payer: Medicare HMO

## 2014-03-10 DIAGNOSIS — N183 Chronic kidney disease, stage 3 (moderate): Secondary | ICD-10-CM | POA: Insufficient documentation

## 2014-03-10 DIAGNOSIS — D631 Anemia in chronic kidney disease: Secondary | ICD-10-CM | POA: Diagnosis not present

## 2014-03-10 LAB — RENAL FUNCTION PANEL
Albumin: 2.3 g/dL — ABNORMAL LOW (ref 3.5–5.2)
Anion gap: 5 (ref 5–15)
BUN: 17 mg/dL (ref 6–23)
CHLORIDE: 112 mmol/L (ref 96–112)
CO2: 25 mmol/L (ref 19–32)
Calcium: 8.3 mg/dL — ABNORMAL LOW (ref 8.4–10.5)
Creatinine, Ser: 2.04 mg/dL — ABNORMAL HIGH (ref 0.50–1.10)
GFR calc Af Amer: 25 mL/min — ABNORMAL LOW (ref >90)
GFR calc non Af Amer: 21 mL/min — ABNORMAL LOW (ref >90)
GLUCOSE: 156 mg/dL — AB (ref 70–99)
POTASSIUM: 4.6 mmol/L (ref 3.5–5.1)
Phosphorus: 3.6 mg/dL (ref 2.3–4.6)
Sodium: 142 mmol/L (ref 135–145)

## 2014-03-10 LAB — POCT HEMOGLOBIN-HEMACUE: HEMOGLOBIN: 9.2 g/dL — AB (ref 12.0–15.0)

## 2014-03-10 LAB — MAGNESIUM: Magnesium: 1.7 mg/dL (ref 1.5–2.5)

## 2014-03-10 MED ORDER — SODIUM CHLORIDE 0.9 % IV SOLN
510.0000 mg | INTRAVENOUS | Status: DC
  Administered 2014-03-10: 510 mg via INTRAVENOUS
  Filled 2014-03-10: qty 17

## 2014-03-10 MED ORDER — EPOETIN ALFA 40000 UNIT/ML IJ SOLN
INTRAMUSCULAR | Status: AC
  Administered 2014-03-10: 40000 [IU] via SUBCUTANEOUS
  Filled 2014-03-10: qty 1

## 2014-03-10 MED ORDER — EPOETIN ALFA 10000 UNIT/ML IJ SOLN: 40000.0000 [IU] | INTRAMUSCULAR | Status: DC

## 2014-03-12 ENCOUNTER — Ambulatory Visit: Payer: Medicare HMO | Admitting: Cardiology

## 2014-03-16 ENCOUNTER — Other Ambulatory Visit (HOSPITAL_COMMUNITY): Payer: Self-pay | Admitting: *Deleted

## 2014-03-17 ENCOUNTER — Encounter (HOSPITAL_COMMUNITY)
Admission: RE | Admit: 2014-03-17 | Discharge: 2014-03-17 | Disposition: A | Discharge status: Home / Self Care | Payer: Medicare Other | Source: Ambulatory Visit | Attending: Nephrology | Admitting: Nephrology

## 2014-03-17 DIAGNOSIS — D631 Anemia in chronic kidney disease: Secondary | ICD-10-CM | POA: Diagnosis not present

## 2014-03-17 LAB — POCT HEMOGLOBIN-HEMACUE: Hemoglobin: 9.8 g/dL — ABNORMAL LOW (ref 12.0–15.0)

## 2014-03-17 MED ORDER — EPOETIN ALFA 10000 UNIT/ML IJ SOLN: 40000.0000 [IU] | INTRAMUSCULAR | Status: DC

## 2014-03-17 MED ORDER — SODIUM CHLORIDE 0.9 % IV SOLN
510.0000 mg | INTRAVENOUS | Status: AC
  Administered 2014-03-17: 510 mg via INTRAVENOUS
  Filled 2014-03-17: qty 17

## 2014-03-18 MED ORDER — EPOETIN ALFA 40000 UNIT/ML IJ SOLN
INTRAMUSCULAR | Status: AC
  Administered 2014-03-17: 40000 [IU] via SUBCUTANEOUS
  Filled 2014-03-18: qty 1

## 2014-03-24 ENCOUNTER — Encounter (HOSPITAL_COMMUNITY): Payer: Medicare HMO

## 2014-03-31 ENCOUNTER — Encounter (HOSPITAL_COMMUNITY)
Admission: RE | Admit: 2014-03-31 | Discharge: 2014-03-31 | Disposition: A | Discharge status: Home / Self Care | Payer: Medicare Other | Source: Ambulatory Visit | Attending: Nephrology | Admitting: Nephrology

## 2014-03-31 DIAGNOSIS — D631 Anemia in chronic kidney disease: Secondary | ICD-10-CM | POA: Diagnosis not present

## 2014-03-31 LAB — IRON AND TIBC
Iron: 118 ug/dL (ref 42–145)
UIBC: <15 ug/dL — ABNORMAL LOW (ref 125–400)

## 2014-03-31 LAB — POCT HEMOGLOBIN-HEMACUE: HEMOGLOBIN: 9.3 g/dL — AB (ref 12.0–15.0)

## 2014-03-31 MED ORDER — EPOETIN ALFA 40000 UNIT/ML IJ SOLN
INTRAMUSCULAR | Status: AC
  Filled 2014-03-31: qty 1

## 2014-03-31 MED ORDER — EPOETIN ALFA 10000 UNIT/ML IJ SOLN
40000.0000 [IU] | INTRAMUSCULAR | Status: DC
  Administered 2014-03-31: 40000 [IU] via SUBCUTANEOUS

## 2014-04-01 LAB — FERRITIN: FERRITIN: 1005 ng/mL — AB (ref 10–291)

## 2014-04-07 ENCOUNTER — Encounter (HOSPITAL_COMMUNITY)
Admission: RE | Admit: 2014-04-07 | Discharge: 2014-04-07 | Disposition: A | Discharge status: Home / Self Care | Payer: Medicare Other | Source: Ambulatory Visit | Attending: Nephrology | Admitting: Nephrology

## 2014-04-07 DIAGNOSIS — D631 Anemia in chronic kidney disease: Secondary | ICD-10-CM | POA: Diagnosis not present

## 2014-04-07 LAB — RENAL FUNCTION PANEL
ALBUMIN: 2.3 g/dL — AB (ref 3.5–5.2)
Anion gap: 6 (ref 5–15)
BUN: 16 mg/dL (ref 6–23)
CO2: 23 mmol/L (ref 19–32)
Calcium: 8.7 mg/dL (ref 8.4–10.5)
Chloride: 111 mmol/L (ref 96–112)
Creatinine, Ser: 1.86 mg/dL — ABNORMAL HIGH (ref 0.50–1.10)
GFR calc non Af Amer: 24 mL/min — ABNORMAL LOW (ref >90)
GFR, EST AFRICAN AMERICAN: 28 mL/min — AB (ref >90)
Glucose, Bld: 153 mg/dL — ABNORMAL HIGH (ref 70–99)
PHOSPHORUS: 3.6 mg/dL (ref 2.3–4.6)
Potassium: 4.6 mmol/L (ref 3.5–5.1)
SODIUM: 140 mmol/L (ref 135–145)

## 2014-04-07 LAB — MAGNESIUM: Magnesium: 1.5 mg/dL (ref 1.5–2.5)

## 2014-04-07 LAB — POCT HEMOGLOBIN-HEMACUE: Hemoglobin: 9.3 g/dL — ABNORMAL LOW (ref 12.0–15.0)

## 2014-04-07 MED ORDER — EPOETIN ALFA 10000 UNIT/ML IJ SOLN
40000.0000 [IU] | INTRAMUSCULAR | Status: DC
Start: 2014-04-07 — End: 2014-04-08

## 2014-04-07 MED ORDER — EPOETIN ALFA 40000 UNIT/ML IJ SOLN
INTRAMUSCULAR | Status: AC
  Administered 2014-04-07: 40000 [IU] via SUBCUTANEOUS
  Filled 2014-04-07: qty 1

## 2014-04-14 ENCOUNTER — Encounter (HOSPITAL_COMMUNITY)
Admission: RE | Admit: 2014-04-14 | Discharge: 2014-04-14 | Disposition: A | Discharge status: Home / Self Care | Payer: Medicare Other | Source: Ambulatory Visit | Attending: Nephrology | Admitting: Nephrology

## 2014-04-14 DIAGNOSIS — N183 Chronic kidney disease, stage 3 (moderate): Secondary | ICD-10-CM | POA: Insufficient documentation

## 2014-04-14 DIAGNOSIS — D631 Anemia in chronic kidney disease: Secondary | ICD-10-CM | POA: Insufficient documentation

## 2014-04-14 LAB — POCT HEMOGLOBIN-HEMACUE: HEMOGLOBIN: 10 g/dL — AB (ref 12.0–15.0)

## 2014-04-14 MED ORDER — EPOETIN ALFA 10000 UNIT/ML IJ SOLN: 40000.0000 [IU] | INTRAMUSCULAR | Status: DC

## 2014-04-14 MED ORDER — EPOETIN ALFA 40000 UNIT/ML IJ SOLN
INTRAMUSCULAR | Status: AC
  Administered 2014-04-14: 40000 [IU] via SUBCUTANEOUS
  Filled 2014-04-14: qty 1

## 2014-04-21 ENCOUNTER — Encounter (HOSPITAL_COMMUNITY)
Admission: RE | Admit: 2014-04-21 | Discharge: 2014-04-21 | Disposition: A | Discharge status: Home / Self Care | Payer: Medicare Other | Source: Ambulatory Visit | Attending: Nephrology | Admitting: Nephrology

## 2014-04-21 DIAGNOSIS — D631 Anemia in chronic kidney disease: Secondary | ICD-10-CM | POA: Diagnosis not present

## 2014-04-21 LAB — POCT HEMOGLOBIN-HEMACUE: Hemoglobin: 10.9 g/dL — ABNORMAL LOW (ref 12.0–15.0)

## 2014-04-21 MED ORDER — EPOETIN ALFA 10000 UNIT/ML IJ SOLN: 40000.0000 [IU] | INTRAMUSCULAR | Status: DC

## 2014-04-21 MED ORDER — EPOETIN ALFA 40000 UNIT/ML IJ SOLN
INTRAMUSCULAR | Status: AC
  Administered 2014-04-21: 40000 [IU] via SUBCUTANEOUS
  Filled 2014-04-21: qty 1

## 2014-04-28 ENCOUNTER — Encounter (HOSPITAL_COMMUNITY)
Admission: RE | Admit: 2014-04-28 | Discharge: 2014-04-28 | Disposition: A | Discharge status: Home / Self Care | Payer: Medicare Other | Source: Ambulatory Visit | Attending: Nephrology | Admitting: Nephrology

## 2014-04-28 DIAGNOSIS — D631 Anemia in chronic kidney disease: Secondary | ICD-10-CM | POA: Diagnosis not present

## 2014-04-28 LAB — POCT HEMOGLOBIN-HEMACUE: Hemoglobin: 10.9 g/dL — ABNORMAL LOW (ref 12.0–15.0)

## 2014-04-28 MED ORDER — EPOETIN ALFA 40000 UNIT/ML IJ SOLN
INTRAMUSCULAR | Status: AC
  Administered 2014-04-28: 40000 [IU] via SUBCUTANEOUS
  Filled 2014-04-28: qty 1

## 2014-04-28 MED ORDER — EPOETIN ALFA 10000 UNIT/ML IJ SOLN
40000.0000 [IU] | INTRAMUSCULAR | Status: DC
Start: 2014-04-28 — End: 2014-04-29

## 2014-04-29 LAB — IRON AND TIBC
IRON: 75 ug/dL (ref 42–145)
Saturation Ratios: 80 % — ABNORMAL HIGH (ref 20–55)
TIBC: 94 ug/dL — ABNORMAL LOW (ref 250–470)
UIBC: 19 ug/dL — ABNORMAL LOW (ref 125–400)

## 2014-04-29 LAB — FERRITIN: Ferritin: 635 ng/mL — ABNORMAL HIGH (ref 10–291)

## 2014-05-01 ENCOUNTER — Other Ambulatory Visit: Payer: Self-pay

## 2014-05-01 MED ORDER — AMLODIPINE BESYLATE 5 MG PO TABS: ORAL_TABLET | ORAL | Status: DC

## 2014-05-05 ENCOUNTER — Encounter (HOSPITAL_COMMUNITY)
Admission: RE | Admit: 2014-05-05 | Discharge: 2014-05-05 | Disposition: A | Discharge status: Home / Self Care | Payer: Medicare Other | Source: Ambulatory Visit | Attending: Nephrology | Admitting: Nephrology

## 2014-05-05 DIAGNOSIS — D631 Anemia in chronic kidney disease: Secondary | ICD-10-CM | POA: Insufficient documentation

## 2014-05-05 DIAGNOSIS — N183 Chronic kidney disease, stage 3 (moderate): Secondary | ICD-10-CM | POA: Insufficient documentation

## 2014-05-05 LAB — POCT HEMOGLOBIN-HEMACUE: Hemoglobin: 11.2 g/dL — ABNORMAL LOW (ref 12.0–15.0)

## 2014-05-05 LAB — RENAL FUNCTION PANEL
Albumin: 2.2 g/dL — ABNORMAL LOW (ref 3.5–5.2)
Anion gap: 8 (ref 5–15)
BUN: 19 mg/dL (ref 6–23)
CALCIUM: 8.3 mg/dL — AB (ref 8.4–10.5)
CHLORIDE: 108 mmol/L (ref 96–112)
CO2: 24 mmol/L (ref 19–32)
Creatinine, Ser: 1.92 mg/dL — ABNORMAL HIGH (ref 0.50–1.10)
GFR, EST AFRICAN AMERICAN: 26 mL/min — AB (ref >90)
GFR, EST NON AFRICAN AMERICAN: 23 mL/min — AB (ref >90)
GLUCOSE: 145 mg/dL — AB (ref 70–99)
Phosphorus: 4.1 mg/dL (ref 2.3–4.6)
Potassium: 4.1 mmol/L (ref 3.5–5.1)
SODIUM: 140 mmol/L (ref 135–145)

## 2014-05-05 LAB — MAGNESIUM: MAGNESIUM: 1.6 mg/dL (ref 1.5–2.5)

## 2014-05-05 MED ORDER — EPOETIN ALFA 40000 UNIT/ML IJ SOLN
INTRAMUSCULAR | Status: AC
  Administered 2014-05-05: 40000 [IU] via SUBCUTANEOUS
  Filled 2014-05-05: qty 1

## 2014-05-05 MED ORDER — EPOETIN ALFA 10000 UNIT/ML IJ SOLN: 40000.0000 [IU] | INTRAMUSCULAR | Status: DC

## 2014-05-06 ENCOUNTER — Other Ambulatory Visit: Payer: Self-pay

## 2014-05-06 MED ORDER — LISINOPRIL 20 MG PO TABS
ORAL_TABLET | ORAL | Status: DC
Start: 2014-05-06 — End: 2014-09-14

## 2014-05-12 ENCOUNTER — Encounter (HOSPITAL_COMMUNITY): Payer: Medicare HMO

## 2014-05-19 ENCOUNTER — Encounter (HOSPITAL_COMMUNITY)
Admission: RE | Admit: 2014-05-19 | Discharge: 2014-05-19 | Disposition: A | Discharge status: Home / Self Care | Payer: Medicare Other | Source: Ambulatory Visit | Attending: Nephrology | Admitting: Nephrology

## 2014-05-19 DIAGNOSIS — D638 Anemia in other chronic diseases classified elsewhere: Secondary | ICD-10-CM | POA: Insufficient documentation

## 2014-05-19 DIAGNOSIS — N183 Chronic kidney disease, stage 3 (moderate): Secondary | ICD-10-CM | POA: Diagnosis not present

## 2014-05-19 MED ORDER — EPOETIN ALFA 10000 UNIT/ML IJ SOLN: 40000.0000 [IU] | INTRAMUSCULAR | Status: DC

## 2014-05-19 MED ORDER — EPOETIN ALFA 40000 UNIT/ML IJ SOLN
INTRAMUSCULAR | Status: AC
  Administered 2014-05-19: 40000 [IU] via SUBCUTANEOUS
  Filled 2014-05-19: qty 1

## 2014-05-21 ENCOUNTER — Ambulatory Visit: Payer: Medicare Other | Admitting: Cardiology

## 2014-05-22 LAB — POCT HEMOGLOBIN-HEMACUE: Hemoglobin: 10.3 g/dL — ABNORMAL LOW (ref 12.0–15.0)

## 2014-05-26 ENCOUNTER — Encounter (HOSPITAL_COMMUNITY)
Admission: RE | Admit: 2014-05-26 | Discharge: 2014-05-26 | Disposition: A | Discharge status: Home / Self Care | Payer: Medicare Other | Source: Ambulatory Visit | Attending: Nephrology | Admitting: Nephrology

## 2014-05-26 DIAGNOSIS — D638 Anemia in other chronic diseases classified elsewhere: Secondary | ICD-10-CM | POA: Diagnosis not present

## 2014-05-26 LAB — IRON AND TIBC
Iron: 55 ug/dL (ref 28–170)
SATURATION RATIOS: 50 % — AB (ref 10.4–31.8)
TIBC: 111 ug/dL — ABNORMAL LOW (ref 250–450)
UIBC: 56 ug/dL

## 2014-05-26 LAB — FERRITIN: Ferritin: 509 ng/mL — ABNORMAL HIGH (ref 11–307)

## 2014-05-26 LAB — POCT HEMOGLOBIN-HEMACUE: HEMOGLOBIN: 10.1 g/dL — AB (ref 12.0–15.0)

## 2014-05-26 MED ORDER — EPOETIN ALFA 40000 UNIT/ML IJ SOLN
INTRAMUSCULAR | Status: AC
  Administered 2014-05-26: 40000 [IU]
  Filled 2014-05-26: qty 1

## 2014-05-26 MED ORDER — EPOETIN ALFA 10000 UNIT/ML IJ SOLN: 40000.0000 [IU] | INTRAMUSCULAR | Status: DC

## 2014-06-02 ENCOUNTER — Encounter (HOSPITAL_COMMUNITY)
Admission: RE | Admit: 2014-06-02 | Discharge: 2014-06-02 | Disposition: A | Discharge status: Home / Self Care | Payer: Medicare Other | Source: Ambulatory Visit | Attending: Nephrology | Admitting: Nephrology

## 2014-06-02 DIAGNOSIS — Z79899 Other long term (current) drug therapy: Secondary | ICD-10-CM | POA: Diagnosis not present

## 2014-06-02 DIAGNOSIS — D631 Anemia in chronic kidney disease: Secondary | ICD-10-CM | POA: Diagnosis not present

## 2014-06-02 DIAGNOSIS — Z5181 Encounter for therapeutic drug level monitoring: Secondary | ICD-10-CM | POA: Insufficient documentation

## 2014-06-02 DIAGNOSIS — N183 Chronic kidney disease, stage 3 (moderate): Secondary | ICD-10-CM | POA: Diagnosis present

## 2014-06-02 LAB — RENAL FUNCTION PANEL
ANION GAP: 6 (ref 5–15)
Albumin: 2.2 g/dL — ABNORMAL LOW (ref 3.5–5.0)
BUN: 19 mg/dL (ref 6–20)
CHLORIDE: 110 mmol/L (ref 101–111)
CO2: 24 mmol/L (ref 22–32)
Calcium: 8.5 mg/dL — ABNORMAL LOW (ref 8.9–10.3)
Creatinine, Ser: 1.97 mg/dL — ABNORMAL HIGH (ref 0.44–1.00)
GFR calc non Af Amer: 22 mL/min — ABNORMAL LOW (ref >60)
GFR, EST AFRICAN AMERICAN: 26 mL/min — AB (ref >60)
GLUCOSE: 142 mg/dL — AB (ref 65–99)
Phosphorus: 3.9 mg/dL (ref 2.5–4.6)
Potassium: 4.6 mmol/L (ref 3.5–5.1)
Sodium: 140 mmol/L (ref 135–145)

## 2014-06-02 LAB — MAGNESIUM: MAGNESIUM: 1.7 mg/dL (ref 1.7–2.4)

## 2014-06-02 LAB — POCT HEMOGLOBIN-HEMACUE: HEMOGLOBIN: 10.3 g/dL — AB (ref 12.0–15.0)

## 2014-06-02 MED ORDER — EPOETIN ALFA 10000 UNIT/ML IJ SOLN: 40000.0000 [IU] | INTRAMUSCULAR | Status: DC

## 2014-06-02 MED ORDER — EPOETIN ALFA 40000 UNIT/ML IJ SOLN
INTRAMUSCULAR | Status: AC
  Administered 2014-06-02: 40000 [IU] via SUBCUTANEOUS
  Filled 2014-06-02: qty 1

## 2014-06-05 ENCOUNTER — Other Ambulatory Visit (HOSPITAL_COMMUNITY): Payer: Self-pay | Admitting: *Deleted

## 2014-06-09 ENCOUNTER — Encounter (HOSPITAL_COMMUNITY)
Admission: RE | Admit: 2014-06-09 | Discharge: 2014-06-09 | Disposition: A | Discharge status: Home / Self Care | Payer: Self-pay | Source: Ambulatory Visit | Attending: Nephrology | Admitting: Nephrology

## 2014-06-09 MED ORDER — EPOETIN ALFA 10000 UNIT/ML IJ SOLN: 40000.0000 [IU] | INTRAMUSCULAR | Status: DC

## 2014-06-16 ENCOUNTER — Encounter (HOSPITAL_COMMUNITY)
Admission: RE | Admit: 2014-06-16 | Discharge: 2014-06-16 | Disposition: A | Discharge status: Home / Self Care | Payer: Medicare Other | Source: Ambulatory Visit | Attending: Nephrology | Admitting: Nephrology

## 2014-06-16 ENCOUNTER — Telehealth: Payer: Self-pay | Admitting: Cardiology

## 2014-06-16 DIAGNOSIS — N183 Chronic kidney disease, stage 3 (moderate): Secondary | ICD-10-CM | POA: Insufficient documentation

## 2014-06-16 DIAGNOSIS — Z79899 Other long term (current) drug therapy: Secondary | ICD-10-CM

## 2014-06-16 DIAGNOSIS — D631 Anemia in chronic kidney disease: Secondary | ICD-10-CM | POA: Insufficient documentation

## 2014-06-16 LAB — POCT HEMOGLOBIN-HEMACUE: HEMOGLOBIN: 9.5 g/dL — AB (ref 12.0–15.0)

## 2014-06-16 MED ORDER — EPOETIN ALFA 20000 UNIT/ML IJ SOLN
INTRAMUSCULAR | Status: AC
  Administered 2014-06-16: 20000 [IU] via SUBCUTANEOUS
  Filled 2014-06-16: qty 1

## 2014-06-16 MED ORDER — FUROSEMIDE 40 MG PO TABS: 40.0000 mg | ORAL_TABLET | Freq: Two times a day (BID) | ORAL | Status: DC

## 2014-06-16 MED ORDER — EPOETIN ALFA 10000 UNIT/ML IJ SOLN
INTRAMUSCULAR | Status: AC
  Administered 2014-06-16: 10000 [IU] via SUBCUTANEOUS
  Filled 2014-06-16: qty 1

## 2014-06-16 MED ORDER — EPOETIN ALFA 10000 UNIT/ML IJ SOLN: 30000.0000 [IU] | INTRAMUSCULAR | Status: DC

## 2014-06-16 NOTE — Addendum Note (Signed)
Addended by: Ladell Heads on: 06/16/2014 04:48 PM   Modules accepted: Orders

## 2014-06-16 NOTE — Telephone Encounter (Signed)
I would increase lasix to 40 mg bid. Recent renal function has been stable. Repeat BMET in 1-2 weeks. If she does not improve we will see where she can be worked in.  Jordyne Poehlman [place] MD, [facility]

## 2014-06-16 NOTE — Telephone Encounter (Signed)
Left message on machine for patient to call.

## 2014-06-16 NOTE — Telephone Encounter (Signed)
Spoke to patient, patient's daughter - she states symptoms have been ongoing for a month or longer.  Notes gradual swelling in feet and legs. Pt cannot put shoes on, generally.  Pt denies orthopnea, no SOB generally.  States no other ongoing acute concerns.  Pt is taking furosemide 20mg  BID, no PRN dose noted.  Also taking Spironolactone 100mg  DAILY  Concern for kidney function in relation to dosing diuretics - as no urgent symptoms, deferring to Dr. to advise on any med changes.  Daughter wanted to see if able to be seen sooner than upcoming August appt. - last appt had to be cancelled/rescheduled due to daughter having acute illness. Informed her I would route to Dr. [place]and, see if med mgmt portion can be handled by extender or pharmD in advance of August appt. Caller agreeable to plan.

## 2014-06-16 NOTE — Telephone Encounter (Signed)
Instructions communicated to patient, daughter. Verbalized understanding of instructions. BMET ordered.   Increased dose for Lasix sent to pharmacy of preference.  All changes communicated to patient, instructed to return call in ~2 weeks if no change noted/worse/new symptoms.

## 2014-06-16 NOTE — Telephone Encounter (Signed)
Pt c/o swelling: STAT is pt has developed SOB within 24 hours  1. How long have you been experiencing swelling? A month  2. Where is the swelling located? Both feet and legs  3.  Are you currently taking a "fluid pill"? Yes 2 a day  4.  Are you currently SOB? no  5.  Have you traveled recently? no

## 2014-06-17 LAB — PTH, INTACT AND CALCIUM
CALCIUM TOTAL (PTH): 8.5 mg/dL — AB (ref 8.7–10.3)
PTH: 21 pg/mL (ref 15–65)

## 2014-06-29 ENCOUNTER — Other Ambulatory Visit (HOSPITAL_COMMUNITY): Payer: Self-pay

## 2014-06-30 ENCOUNTER — Encounter (HOSPITAL_COMMUNITY)
Admission: RE | Admit: 2014-06-30 | Discharge: 2014-06-30 | Disposition: A | Discharge status: Home / Self Care | Payer: Medicare Other | Source: Ambulatory Visit | Attending: Nephrology | Admitting: Nephrology

## 2014-06-30 DIAGNOSIS — D631 Anemia in chronic kidney disease: Secondary | ICD-10-CM | POA: Diagnosis not present

## 2014-06-30 LAB — RENAL FUNCTION PANEL
ALBUMIN: 2.2 g/dL — AB (ref 3.5–5.0)
Anion gap: 4 — ABNORMAL LOW (ref 5–15)
BUN: 22 mg/dL — ABNORMAL HIGH (ref 6–20)
CHLORIDE: 106 mmol/L (ref 101–111)
CO2: 28 mmol/L (ref 22–32)
CREATININE: 1.87 mg/dL — AB (ref 0.44–1.00)
Calcium: 7.9 mg/dL — ABNORMAL LOW (ref 8.9–10.3)
GFR calc non Af Amer: 24 mL/min — ABNORMAL LOW (ref >60)
GFR, EST AFRICAN AMERICAN: 27 mL/min — AB (ref >60)
Glucose, Bld: 173 mg/dL — ABNORMAL HIGH (ref 65–99)
PHOSPHORUS: 3.2 mg/dL (ref 2.5–4.6)
Potassium: 3.9 mmol/L (ref 3.5–5.1)
SODIUM: 138 mmol/L (ref 135–145)

## 2014-06-30 LAB — POCT HEMOGLOBIN-HEMACUE: HEMOGLOBIN: 8.6 g/dL — AB (ref 12.0–15.0)

## 2014-06-30 LAB — IRON AND TIBC
IRON: 111 ug/dL (ref 28–170)
Saturation Ratios: 84 % — ABNORMAL HIGH (ref 10.4–31.8)
TIBC: 132 ug/dL — AB (ref 250–450)
UIBC: 21 ug/dL

## 2014-06-30 LAB — FERRITIN: FERRITIN: 540 ng/mL — AB (ref 11–307)

## 2014-06-30 MED ORDER — EPOETIN ALFA 10000 UNIT/ML IJ SOLN: 30000.0000 [IU] | INTRAMUSCULAR | Status: DC

## 2014-06-30 MED ORDER — EPOETIN ALFA 10000 UNIT/ML IJ SOLN
INTRAMUSCULAR | Status: AC
  Administered 2014-06-30: 10000 [IU]
  Filled 2014-06-30: qty 1

## 2014-06-30 MED ORDER — EPOETIN ALFA 20000 UNIT/ML IJ SOLN
INTRAMUSCULAR | Status: AC
  Administered 2014-06-30: 20000 [IU]
  Filled 2014-06-30: qty 1

## 2014-07-06 ENCOUNTER — Telehealth: Payer: Self-pay | Admitting: Cardiology

## 2014-07-06 NOTE — Telephone Encounter (Signed)
Returned call to patient's daughter [name].She stated mother continues to have swelling in both legs and feet even after increasing lasix to 40 mg twice a day.Stated swelling in lower legs and upper legs.Legs are red and warm to touch.Left leg sore to touch.She has a sore on right lower leg, no drainage.Advised she needs to see PCP.Advised to call back if needed.

## 2014-07-06 NOTE — Telephone Encounter (Signed)
Please call,pt has a lot of swelling her legs and feet. It still have not improved.

## 2014-07-07 ENCOUNTER — Telehealth: Payer: Self-pay | Admitting: Cardiology

## 2014-07-07 NOTE — Telephone Encounter (Signed)
Spoke with Jennifer Neal, the medical doctor feels the swelling is related to her heart issues. Felt not to be cellulitis. They referred the patient back to cardiology to be seen. No available appts with dr [place] currently. Will forward to cheryl pugh lpn to help with scheduling. Jennifer Neal aware cheryl is not here today but will be back in touch with her about an appt. Jennifer Neal agreed with this plan.

## 2014-07-07 NOTE — Telephone Encounter (Signed)
She talked to [place] about pt swelling. Chery advised her to go to her primary doctor.She took her to her primary doctor,he says she needs an appt with her Cardiologist.

## 2014-07-08 NOTE — Telephone Encounter (Signed)
Received 07/07/14 office note from Dr.Burnett faxed to Ward Givens PA,fax # [phone].

## 2014-07-08 NOTE — Telephone Encounter (Signed)
Returned call to patient's daughter [name].Appointment scheduled with Ward Givens PA 07/09/14 at 1:30 pm.Office note requested with [facility].

## 2014-07-09 ENCOUNTER — Encounter: Payer: Self-pay | Admitting: Nurse Practitioner

## 2014-07-09 ENCOUNTER — Ambulatory Visit (INDEPENDENT_AMBULATORY_CARE_PROVIDER_SITE_OTHER): Payer: Medicare Other | Admitting: Nurse Practitioner

## 2014-07-09 VITALS — BP 136/68 | HR 61 | Ht 62.0 in | Wt 176.4 lb

## 2014-07-09 DIAGNOSIS — I5033 Acute on chronic diastolic (congestive) heart failure: Secondary | ICD-10-CM

## 2014-07-09 DIAGNOSIS — R600 Localized edema: Secondary | ICD-10-CM | POA: Diagnosis not present

## 2014-07-09 DIAGNOSIS — I350 Nonrheumatic aortic (valve) stenosis: Secondary | ICD-10-CM | POA: Diagnosis not present

## 2014-07-09 DIAGNOSIS — I10 Essential (primary) hypertension: Secondary | ICD-10-CM | POA: Diagnosis not present

## 2014-07-09 DIAGNOSIS — D638 Anemia in other chronic diseases classified elsewhere: Secondary | ICD-10-CM | POA: Insufficient documentation

## 2014-07-09 MED ORDER — FUROSEMIDE 40 MG PO TABS: 40.0000 mg | ORAL_TABLET | Freq: Two times a day (BID) | ORAL | Status: DC

## 2014-07-09 NOTE — Patient Instructions (Signed)
Medication Instructions:  Your physician recommends that you continue on your current medications as directed EXCEPT LASIX   TAKE LASIX TWICE A DAY FOR THE NEXT WEEK   Labwork:   Testing/Procedures:   Follow-Up:  HAS BEEN SCHEDULED SEE TOP OF OFFICE VISIT NOTE   Any Other Special Instructions Will Be Listed Below (If Applicable).

## 2014-07-09 NOTE — Progress Notes (Signed)
Patient Name: Jennifer Neal Date of Encounter: 07/09/2014  Primary Care Provider:  Delorse Lek, MD Primary Cardiologist:  P. [place], MD   Chief Complaint  81 year old female with a history of severe aortic stenosis and chronic diastolic heart failure who presents secondary to increasing lower extremity edema.  Past Medical History   Past Medical History  Diagnosis Date  . Essential hypertension   . Severe aortic stenosis     a. 03/2013 Echo: EF 55-60%, mod LVH, Severe Ao Stenosis, mild MR, mildly dil LA, PASP .  Marland Kitchen Rheumatoid arthritis(714.0)   . Asthma   . Hepatic cirrhosis   . Anemia of chronic disease     a. Regular Procrit injections.  . CKD (chronic kidney disease), stage III   . History of pneumonia Dec 2012  . Diastolic CHF, chronic     a. 03/2013 Echo: EF 55-60%, mod LVH, Severe Ao Stenosis, mild MR, mildly dil LA, PASP .  Marland Kitchen Resting tremor   . Lower extremity edema    Past Surgical History  Procedure Laterality Date  . Abdominal hysterectomy    . Breast lumpectomy      right    Allergies  Allergies  Allergen Reactions  . Codeine   . Penicillins   . Sulfa Drugs Cross Reactors     HPI  81 year old female with the above complex problem list. She has a history of diastolic CHF as well as severe aortic stenosis by most recent echo in March 2015. She was last seen by Dr. Swaziland in November 2015 related to dyspnea and diastolic heart failure. She was not felt to be a candidate for open aortic valve replacement and was also felt to be a poor candidate for TAVR secondary to multiple comorbidities including cirrhosis. She also has a history of anemia and is on Procrit injections.  Approximately 4-6 weeks ago, her daughters began to notice increasing bilateral lower extremity edema extending to the hips. She called into our [place] office and was advised to increase her Lasix from 20 twice a day to 40 twice a day. She instead did 40 in the morning  and 20 in the afternoon. With that, she had about a 5 pound weight loss down from 181-176. She is currently taking 40 mg daily and continues to have significant bilateral lower extremity edema. Weight today remains at 176 which is up 10 pounds from her weight in March 2016. Despite significant lower extremity edema, she has not had any PND, orthopnea, dizziness, syncope, chest pain, or early satiety. She weighs regularly at home and is vigilant about her salt intake.  Home Medications  Prior to Admission medications   Medication Sig Start Date End Date Taking? Authorizing Provider  amLODipine (NORVASC) 5 MG tablet 1/2 tab by mouth daily 05/01/14   Peter M [place], MD  aspirin 81 MG EC tablet Take 81 mg by mouth at bedtime.     Historical Provider, MD  epoetin alfa (EPOGEN,PROCRIT) 16109 UNIT/ML injection Inject 20,000 Units into the skin every Monday.     Historical Provider, MD  ferrous sulfate 325 (65 FE) MG tablet Take 325 mg by mouth 2 (two) times daily with a meal.    Historical Provider, MD  furosemide (LASIX) 40 MG tablet Take 1 tablet (40 mg total) by mouth 2 (two) times daily. T 06/16/14   Peter M [place], MD  lisinopril (PRINIVIL,ZESTRIL) 20 MG tablet TAKE 1 TABLET BY MOUTH TWICE A DAY 05/06/14   Peter M [place], MD  spironolactone (ALDACTONE) 25 MG tablet Take 100 mg by mouth every morning.     Historical Provider, MD  trolamine salicylate (ASPERCREME) 10 % cream Apply 1 application topically as needed for muscle pain.    Historical Provider, MD    Review of Systems  As above, she has been experiencing significant lower extremity edema in the absence of PND, orthopnea, dizziness, syncope, or early satiety. She does not have chest pain. Activity is fairly limited and she does experience dyspnea on exertion.  All other systems reviewed and are otherwise negative except as noted above.  Physical Exam  VS:  There were no vitals taken for this visit. , BMI There is no weight on file to calculate  BMI. GEN: Well nourished, well developed, in no acute distress. HEENT: normal. Neck: Supple,  JVP approximately 12 cm, no carotid bruits, or masses. Cardiac: RRR3/6 systolic ejection murmur at the right upper sternal border with an audible S2. No clubbing, cyanosis.   she has 2-3+ bilateral lower extremity edema circumferentially to the knees and then has posterior thigh edema extending to the buttocks. No flank edema. Radials/DP/PT 1 + and equal bilaterally.  Respiratory:  Respirations regular and unlabored, clear to auscultation bilaterally. GI: Soft, nontender, nondistended, BS + x 4. MS: no deformity or atrophy. Skin: warm and dry, no rash. Neuro:  Strength and sensation are intact. Psych: Normal affect.  Accessory Clinical Findings  ECG -regular sinus rhythm, 61, no acute ST or T changes.   Assessment & Plan  1.  Acute on chronic diastolic congestive heart failure/lower extremity edema: in the setting of severe aortic stenosis and hepatic cirrhosis. Her heart rate and blood pressure reasonably well controlled. She has significant bilateral lower extremity edema extending up to the mid thigh. Her weight is up 10 pounds since March 2008. She did respond to increased dose of Lasix earlier this month with 5 pound weight loss and her weight has held steady since dropping back down to Lasix 40 mg daily. She had a basic metabolic panel last week which showed stable creatinine at 1.87 with a BUN of 22. I have recommended that we again increase her Lasix to 40 mg twice a day for another week. We'll arrange for follow-up in this clinic in 1 week at which time, if she continues to have edema and requires a higher dose of Lasix, we can repeat a basic metabolic profile. Otherwise continue spironolactone and lisinopril.  2. Severe aortic stenosis: Due to comorbidities, she is not felt to be a candidate for SAVR or TAVR.   3. Essential hypertension: Stable.  4. Hepatic cirrhosis: This is followed by  Dr. Randa Evens and is certainly playing a role in her lower extremity edema.  5. Stage III chronic kidney disease: Stable BUN and creatinine last week. If she continues to require a higher dose of Lasix upon next week's visit, we will have to repeat a basic metabolic profile.  6. Disposition: Follow-up in one week.   Nicolasa Ducking, NP 07/09/2014, 1:50 PM

## 2014-07-14 ENCOUNTER — Encounter (HOSPITAL_COMMUNITY)
Admission: RE | Admit: 2014-07-14 | Discharge: 2014-07-14 | Disposition: A | Discharge status: Home / Self Care | Payer: Medicare Other | Source: Ambulatory Visit | Attending: Nephrology | Admitting: Nephrology

## 2014-07-14 DIAGNOSIS — Z79899 Other long term (current) drug therapy: Secondary | ICD-10-CM | POA: Diagnosis not present

## 2014-07-14 DIAGNOSIS — N183 Chronic kidney disease, stage 3 (moderate): Secondary | ICD-10-CM | POA: Diagnosis present

## 2014-07-14 DIAGNOSIS — Z5181 Encounter for therapeutic drug level monitoring: Secondary | ICD-10-CM | POA: Insufficient documentation

## 2014-07-14 DIAGNOSIS — D631 Anemia in chronic kidney disease: Secondary | ICD-10-CM | POA: Diagnosis not present

## 2014-07-14 LAB — POCT HEMOGLOBIN-HEMACUE: HEMOGLOBIN: 8.1 g/dL — AB (ref 12.0–15.0)

## 2014-07-14 MED ORDER — EPOETIN ALFA 10000 UNIT/ML IJ SOLN
INTRAMUSCULAR | Status: AC
  Filled 2014-07-14: qty 1

## 2014-07-14 MED ORDER — EPOETIN ALFA 20000 UNIT/ML IJ SOLN
INTRAMUSCULAR | Status: AC
  Administered 2014-07-14: 20000 [IU] via SUBCUTANEOUS
  Filled 2014-07-14: qty 1

## 2014-07-14 MED ORDER — EPOETIN ALFA 10000 UNIT/ML IJ SOLN
30000.0000 [IU] | INTRAMUSCULAR | Status: DC
  Administered 2014-07-14: 10000 [IU] via SUBCUTANEOUS

## 2014-07-15 LAB — PTH, INTACT AND CALCIUM
Calcium, Total (PTH): 8.4 mg/dL — ABNORMAL LOW (ref 8.7–10.3)
PTH: 31 pg/mL (ref 15–65)

## 2014-07-16 ENCOUNTER — Ambulatory Visit: Payer: Medicare Other | Admitting: Cardiology

## 2014-07-28 ENCOUNTER — Encounter (HOSPITAL_COMMUNITY)
Admission: RE | Admit: 2014-07-28 | Discharge: 2014-07-28 | Disposition: A | Discharge status: Home / Self Care | Payer: Medicare Other | Source: Ambulatory Visit | Attending: Nephrology | Admitting: Nephrology

## 2014-07-28 DIAGNOSIS — N183 Chronic kidney disease, stage 3 (moderate): Secondary | ICD-10-CM | POA: Diagnosis not present

## 2014-07-28 LAB — FERRITIN: Ferritin: 629 ng/mL — ABNORMAL HIGH (ref 11–307)

## 2014-07-28 LAB — RENAL FUNCTION PANEL
ALBUMIN: 2.3 g/dL — AB (ref 3.5–5.0)
ANION GAP: 4 — AB (ref 5–15)
BUN: 24 mg/dL — ABNORMAL HIGH (ref 6–20)
CHLORIDE: 107 mmol/L (ref 101–111)
CO2: 28 mmol/L (ref 22–32)
Calcium: 8.3 mg/dL — ABNORMAL LOW (ref 8.9–10.3)
Creatinine, Ser: 2.33 mg/dL — ABNORMAL HIGH (ref 0.44–1.00)
GFR calc non Af Amer: 18 mL/min — ABNORMAL LOW (ref >60)
GFR, EST AFRICAN AMERICAN: 21 mL/min — AB (ref >60)
Glucose, Bld: 160 mg/dL — ABNORMAL HIGH (ref 65–99)
POTASSIUM: 3.9 mmol/L (ref 3.5–5.1)
Phosphorus: 3.5 mg/dL (ref 2.5–4.6)
Sodium: 139 mmol/L (ref 135–145)

## 2014-07-28 LAB — IRON AND TIBC
Iron: 99 ug/dL (ref 28–170)
Saturation Ratios: 70 % — ABNORMAL HIGH (ref 10.4–31.8)
TIBC: 141 ug/dL — AB (ref 250–450)
UIBC: 42 ug/dL

## 2014-07-28 LAB — POCT HEMOGLOBIN-HEMACUE: Hemoglobin: 8 g/dL — ABNORMAL LOW (ref 12.0–15.0)

## 2014-07-28 MED ORDER — EPOETIN ALFA 20000 UNIT/ML IJ SOLN
INTRAMUSCULAR | Status: AC
  Administered 2014-07-28: 20000 [IU] via SUBCUTANEOUS
  Filled 2014-07-28: qty 1

## 2014-07-28 MED ORDER — EPOETIN ALFA 10000 UNIT/ML IJ SOLN: 30000.0000 [IU] | INTRAMUSCULAR | Status: DC

## 2014-07-28 MED ORDER — EPOETIN ALFA 10000 UNIT/ML IJ SOLN
INTRAMUSCULAR | Status: AC
  Administered 2014-07-28: 10000 [IU] via SUBCUTANEOUS
  Filled 2014-07-28: qty 1

## 2014-08-11 ENCOUNTER — Encounter (HOSPITAL_COMMUNITY)
Admission: RE | Admit: 2014-08-11 | Discharge: 2014-08-11 | Disposition: A | Discharge status: Home / Self Care | Payer: Medicare Other | Source: Ambulatory Visit | Attending: Nephrology | Admitting: Nephrology

## 2014-08-11 DIAGNOSIS — Z79899 Other long term (current) drug therapy: Secondary | ICD-10-CM | POA: Insufficient documentation

## 2014-08-11 DIAGNOSIS — Z5181 Encounter for therapeutic drug level monitoring: Secondary | ICD-10-CM | POA: Insufficient documentation

## 2014-08-11 DIAGNOSIS — D631 Anemia in chronic kidney disease: Secondary | ICD-10-CM | POA: Insufficient documentation

## 2014-08-11 DIAGNOSIS — N183 Chronic kidney disease, stage 3 (moderate): Secondary | ICD-10-CM | POA: Diagnosis not present

## 2014-08-11 LAB — POCT HEMOGLOBIN-HEMACUE: Hemoglobin: 7.9 g/dL — ABNORMAL LOW (ref 12.0–15.0)

## 2014-08-11 MED ORDER — EPOETIN ALFA 10000 UNIT/ML IJ SOLN
INTRAMUSCULAR | Status: AC
  Filled 2014-08-11: qty 1

## 2014-08-11 MED ORDER — EPOETIN ALFA 10000 UNIT/ML IJ SOLN
30000.0000 [IU] | INTRAMUSCULAR | Status: DC
  Administered 2014-08-11: 10000 [IU] via SUBCUTANEOUS

## 2014-08-11 MED ORDER — EPOETIN ALFA 20000 UNIT/ML IJ SOLN
INTRAMUSCULAR | Status: AC
  Administered 2014-08-11: 20000 [IU] via SUBCUTANEOUS
  Filled 2014-08-11: qty 1

## 2014-08-12 LAB — PTH, INTACT AND CALCIUM
CALCIUM TOTAL (PTH): 8.3 mg/dL — AB (ref 8.7–10.3)
PTH: 28 pg/mL (ref 15–65)

## 2014-08-25 ENCOUNTER — Telehealth: Payer: Self-pay | Admitting: Cardiology

## 2014-08-25 ENCOUNTER — Encounter (HOSPITAL_COMMUNITY)
Admission: RE | Admit: 2014-08-25 | Discharge: 2014-08-25 | Disposition: A | Discharge status: Home / Self Care | Payer: Medicare Other | Source: Ambulatory Visit | Attending: Nephrology | Admitting: Nephrology

## 2014-08-25 DIAGNOSIS — N183 Chronic kidney disease, stage 3 (moderate): Secondary | ICD-10-CM | POA: Diagnosis not present

## 2014-08-25 LAB — POCT HEMOGLOBIN-HEMACUE: Hemoglobin: 7.8 g/dL — ABNORMAL LOW (ref 12.0–15.0)

## 2014-08-25 MED ORDER — EPOETIN ALFA 10000 UNIT/ML IJ SOLN
INTRAMUSCULAR | Status: AC
  Filled 2014-08-25: qty 1

## 2014-08-25 MED ORDER — EPOETIN ALFA 40000 UNIT/ML IJ SOLN
INTRAMUSCULAR | Status: AC
  Administered 2014-08-25: 40000 [IU]
  Filled 2014-08-25: qty 1

## 2014-08-25 MED ORDER — EPOETIN ALFA 10000 UNIT/ML IJ SOLN: 30000.0000 [IU] | INTRAMUSCULAR | Status: DC

## 2014-08-25 MED ORDER — EPOETIN ALFA 20000 UNIT/ML IJ SOLN
INTRAMUSCULAR | Status: AC
  Filled 2014-08-25: qty 1

## 2014-08-25 NOTE — Telephone Encounter (Signed)
Pleas call regarding the patient's edema in her legs and her blood count.

## 2014-08-26 ENCOUNTER — Other Ambulatory Visit (HOSPITAL_COMMUNITY): Payer: Self-pay | Admitting: *Deleted

## 2014-08-26 NOTE — Telephone Encounter (Signed)
Returned call to patient's daughter [name].She stated mother has increase swelling in both lower legs.Swelling radiating up into thighs.Stated she saw Ward Givens NP 60/30/16.He told her ok to take lasix 40 mg 2 tablets every morning.Stated lasix not helping.Stated mother is taking procrit injections and dose was increased yesterday due to low hgb.Stated she is concerned procrit will effect her heart. I will speak to Dr.Jordan today and call her back.

## 2014-08-27 NOTE — Telephone Encounter (Signed)
Returned call to patient's daughter [name] 08/26/14.Dr.Jordan advised patient can increase lasix 40 mg 2 tablets twice a day.Keep appointment with Dr.Jordan 09/01/14 at 8:30 am.

## 2014-09-01 ENCOUNTER — Ambulatory Visit (INDEPENDENT_AMBULATORY_CARE_PROVIDER_SITE_OTHER): Payer: Medicare Other | Admitting: Cardiology

## 2014-09-01 ENCOUNTER — Encounter: Payer: Self-pay | Admitting: Cardiology

## 2014-09-01 VITALS — BP 100/48 | HR 66 | Ht 63.0 in | Wt 164.0 lb

## 2014-09-01 DIAGNOSIS — N183 Chronic kidney disease, stage 3 unspecified: Secondary | ICD-10-CM

## 2014-09-01 DIAGNOSIS — K7460 Unspecified cirrhosis of liver: Secondary | ICD-10-CM

## 2014-09-01 DIAGNOSIS — I350 Nonrheumatic aortic (valve) stenosis: Secondary | ICD-10-CM | POA: Diagnosis not present

## 2014-09-01 DIAGNOSIS — D638 Anemia in other chronic diseases classified elsewhere: Secondary | ICD-10-CM | POA: Diagnosis not present

## 2014-09-01 DIAGNOSIS — I5032 Chronic diastolic (congestive) heart failure: Secondary | ICD-10-CM | POA: Diagnosis not present

## 2014-09-01 LAB — COMPREHENSIVE METABOLIC PANEL
ALK PHOS: 88 U/L (ref 33–130)
ALT: 15 U/L (ref 6–29)
AST: 21 U/L (ref 10–35)
Albumin: 2.4 g/dL — ABNORMAL LOW (ref 3.6–5.1)
BUN: 27 mg/dL — ABNORMAL HIGH (ref 7–25)
CALCIUM: 8.2 mg/dL — AB (ref 8.6–10.4)
CHLORIDE: 107 mmol/L (ref 98–110)
CO2: 28 mmol/L (ref 20–31)
Creat: 2.31 mg/dL — ABNORMAL HIGH (ref 0.60–0.88)
Glucose, Bld: 123 mg/dL — ABNORMAL HIGH (ref 65–99)
POTASSIUM: 4.3 mmol/L (ref 3.5–5.3)
Sodium: 143 mmol/L (ref 135–146)
TOTAL PROTEIN: 5.3 g/dL — AB (ref 6.1–8.1)
Total Bilirubin: 2 mg/dL — ABNORMAL HIGH (ref 0.2–1.2)

## 2014-09-01 NOTE — Progress Notes (Signed)
Patient Name: Jennifer Neal Date of Encounter: 09/01/2014  Primary Care Provider:  Delorse Lek, MD Primary Cardiologist:  P. [place], MD   Chief Complaint  81 year old female with a history of severe aortic stenosis and chronic diastolic heart failure who is seen for follow up edema.  Past Medical History   Past Medical History  Diagnosis Date  . Essential hypertension   . Severe aortic stenosis     a. 03/2013 Echo: EF 55-60%, mod LVH, Severe Ao Stenosis, mild MR, mildly dil LA, PASP .  Marland Kitchen Rheumatoid arthritis(714.0)   . Asthma   . Hepatic cirrhosis   . Anemia of chronic disease     a. Regular Procrit injections.  . CKD (chronic kidney disease), stage III   . History of pneumonia Dec 2012  . Diastolic CHF, chronic     a. 03/2013 Echo: EF 55-60%, mod LVH, Severe Ao Stenosis, mild MR, mildly dil LA, PASP .  Marland Kitchen Resting tremor   . Lower extremity edema    Past Surgical History  Procedure Laterality Date  . Abdominal hysterectomy    . Breast lumpectomy      right    Allergies  Allergies  Allergen Reactions  . Penicillins Swelling  . Sulfa Drugs Cross Reactors Rash  . Codeine Nausea And Vomiting    HPI  81 year old female with the above complex problem list. She has a history of diastolic CHF as well as severe aortic stenosis by most recent echo in March 2015. She was not felt to be a candidate for open aortic valve replacement and was also felt to be a poor candidate for TAVR secondary to multiple comorbidities including cirrhosis. She also has a history of anemia and is on Procrit injections and CKD.  She was seen by Ward Givens NP on 07/09/14 with increased edema and weight up to 176 lbs. Lasix was increased to 80 mg daily. Weight has since come down and she has lost 9 lbs in last week. Reports compliance with sodium restriction. No dizziness, SOB, chest pain. Edema is improved. Procrit injection recently increased. Overall feels she is doing  better.  Home Medications  Prior to Admission medications   Medication Sig Start Date End Date Taking? Authorizing Provider  amLODipine (NORVASC) 5 MG tablet 1/2 tab by mouth daily 05/01/14   Eliora Nienhuis M [place], MD  aspirin 81 MG EC tablet Take 81 mg by mouth at bedtime.     Historical Provider, MD  epoetin alfa (EPOGEN,PROCRIT) 23557 UNIT/ML injection Inject 20,000 Units into the skin every Monday.     Historical Provider, MD  ferrous sulfate 325 (65 FE) MG tablet Take 325 mg by mouth 2 (two) times daily with a meal.    Historical Provider, MD  furosemide (LASIX) 40 MG tablet Take 1 tablet (40 mg total) by mouth 2 (two) times daily. T 06/16/14   Manraj Yeo M [place], MD  lisinopril (PRINIVIL,ZESTRIL) 20 MG tablet TAKE 1 TABLET BY MOUTH TWICE A DAY 05/06/14   Shyloh Krinke M [place], MD  spironolactone (ALDACTONE) 25 MG tablet Take 100 mg by mouth every morning.     Historical Provider, MD  trolamine salicylate (ASPERCREME) 10 % cream Apply 1 application topically as needed for muscle pain.    Historical Provider, MD    Review of Systems  As above, she has  lower extremity edema in the absence of PND, orthopnea, dizziness, syncope, or early satiety. She does not have chest pain. Activity is fairly limited.  All other systems  reviewed and are otherwise negative except as noted above.  Physical Exam  VS:  BP 100/48 mmHg  Pulse 66  Ht 5\' 3"  (1.6 m)  Wt 74.39 kg (164 lb)  BMI 29.06 kg/m2 , BMI Body mass index is 29.06 kg/(m^2). GEN: Well nourished, well developed, in no acute distress. HEENT: normal. Neck: Supple,  JVP approximately 12 cm, no carotid bruits, or masses. Cardiac: RRR3/6 systolic ejection murmur at the right upper sternal border with an audible S2. No clubbing, cyanosis.   she has 2+ bilateral lower extremity edema circumferentially to the knees. No flank edema. Radials/DP/PT 1 + and equal bilaterally.  Respiratory:  Respirations regular and unlabored, clear to auscultation bilaterally. GI: Soft,  nontender, nondistended, BS + x 4. MS: no deformity or atrophy. Skin: warm and dry, no rash. Neuro:  Strength and sensation are intact. Psych: Normal affect.  Accessory Clinical Findings  none  Assessment & Plan  1.  Acute on chronic diastolic congestive heart failure/lower extremity edema: in the setting of severe aortic stenosis and hepatic cirrhosis. Her heart rate and blood pressure reasonably well controlled. She had a good response to increase lasix and weight is lower than it has been in 5 years. Need to avoid over diuresis with risk of cardio- and hepatic/renal syndrome. Will reduce lasix to 40 mg daily. Could increase prn. Will check CMET today. Otherwise continue spironolactone and lisinopril.  2. Severe aortic stenosis: Due to comorbidities, she is not felt to be a candidate for SAVR or TAVR.   3. Essential hypertension: Stable.  4. Hepatic cirrhosis: This is followed by Dr. and is certainly playing a role in her lower extremity edema.  5. Stage III chronic kidney disease: repeat assessment today.  6. Disposition: Follow-up in 3 months.   Randa Evens [facility]  09/01/2014, 8:55 AM

## 2014-09-01 NOTE — Patient Instructions (Signed)
Reduce lasix to 40 mg daily  Continue your other therapy  We will check chemistries today  I will see you in 3 months.

## 2014-09-08 ENCOUNTER — Encounter (HOSPITAL_COMMUNITY)
Admission: RE | Admit: 2014-09-08 | Discharge: 2014-09-08 | Disposition: A | Discharge status: Home / Self Care | Payer: Medicare Other | Source: Ambulatory Visit | Attending: Nephrology | Admitting: Nephrology

## 2014-09-08 DIAGNOSIS — I5033 Acute on chronic diastolic (congestive) heart failure: Principal | ICD-10-CM | POA: Diagnosis present

## 2014-09-08 DIAGNOSIS — D638 Anemia in other chronic diseases classified elsewhere: Secondary | ICD-10-CM | POA: Diagnosis present

## 2014-09-08 DIAGNOSIS — I129 Hypertensive chronic kidney disease with stage 1 through stage 4 chronic kidney disease, or unspecified chronic kidney disease: Secondary | ICD-10-CM | POA: Diagnosis present

## 2014-09-08 DIAGNOSIS — D6959 Other secondary thrombocytopenia: Secondary | ICD-10-CM | POA: Diagnosis present

## 2014-09-08 DIAGNOSIS — K7460 Unspecified cirrhosis of liver: Secondary | ICD-10-CM | POA: Diagnosis present

## 2014-09-08 DIAGNOSIS — Z809 Family history of malignant neoplasm, unspecified: Secondary | ICD-10-CM

## 2014-09-08 DIAGNOSIS — Z8249 Family history of ischemic heart disease and other diseases of the circulatory system: Secondary | ICD-10-CM

## 2014-09-08 DIAGNOSIS — N179 Acute kidney failure, unspecified: Secondary | ICD-10-CM | POA: Diagnosis present

## 2014-09-08 DIAGNOSIS — I350 Nonrheumatic aortic (valve) stenosis: Secondary | ICD-10-CM | POA: Diagnosis present

## 2014-09-08 DIAGNOSIS — Z8701 Personal history of pneumonia (recurrent): Secondary | ICD-10-CM

## 2014-09-08 DIAGNOSIS — J45909 Unspecified asthma, uncomplicated: Secondary | ICD-10-CM | POA: Diagnosis present

## 2014-09-08 DIAGNOSIS — R0602 Shortness of breath: Secondary | ICD-10-CM | POA: Diagnosis not present

## 2014-09-08 DIAGNOSIS — D539 Nutritional anemia, unspecified: Secondary | ICD-10-CM | POA: Diagnosis present

## 2014-09-08 DIAGNOSIS — N184 Chronic kidney disease, stage 4 (severe): Secondary | ICD-10-CM | POA: Diagnosis present

## 2014-09-08 DIAGNOSIS — Z23 Encounter for immunization: Secondary | ICD-10-CM

## 2014-09-08 DIAGNOSIS — M069 Rheumatoid arthritis, unspecified: Secondary | ICD-10-CM | POA: Diagnosis present

## 2014-09-08 LAB — IRON AND TIBC
IRON: 104 ug/dL (ref 28–170)
Saturation Ratios: 77 % — ABNORMAL HIGH (ref 10.4–31.8)
TIBC: 134 ug/dL — AB (ref 250–450)
UIBC: 30 ug/dL

## 2014-09-08 LAB — FERRITIN: Ferritin: 600 ng/mL — ABNORMAL HIGH (ref 11–307)

## 2014-09-08 LAB — RENAL FUNCTION PANEL
ALBUMIN: 2.2 g/dL — AB (ref 3.5–5.0)
Anion gap: 5 (ref 5–15)
BUN: 32 mg/dL — AB (ref 6–20)
CO2: 26 mmol/L (ref 22–32)
CREATININE: 2.9 mg/dL — AB (ref 0.44–1.00)
Calcium: 8.2 mg/dL — ABNORMAL LOW (ref 8.9–10.3)
Chloride: 111 mmol/L (ref 101–111)
GFR calc Af Amer: 16 mL/min — ABNORMAL LOW (ref >60)
GFR calc non Af Amer: 14 mL/min — ABNORMAL LOW (ref >60)
GLUCOSE: 161 mg/dL — AB (ref 65–99)
PHOSPHORUS: 4.1 mg/dL (ref 2.5–4.6)
POTASSIUM: 5.2 mmol/L — AB (ref 3.5–5.1)
Sodium: 142 mmol/L (ref 135–145)

## 2014-09-08 LAB — POCT HEMOGLOBIN-HEMACUE: Hemoglobin: 7.8 g/dL — ABNORMAL LOW (ref 12.0–15.0)

## 2014-09-08 MED ORDER — EPOETIN ALFA 40000 UNIT/ML IJ SOLN
INTRAMUSCULAR | Status: AC
  Administered 2014-09-08: 40000 [IU] via SUBCUTANEOUS
  Filled 2014-09-08: qty 1

## 2014-09-08 MED ORDER — EPOETIN ALFA 10000 UNIT/ML IJ SOLN: 40000.0000 [IU] | INTRAMUSCULAR | Status: DC

## 2014-09-09 LAB — PTH, INTACT AND CALCIUM
Calcium, Total (PTH): 8 mg/dL — ABNORMAL LOW (ref 8.7–10.3)
PTH: 52 pg/mL (ref 15–65)

## 2014-09-11 ENCOUNTER — Encounter (HOSPITAL_COMMUNITY): Payer: Self-pay | Admitting: *Deleted

## 2014-09-11 ENCOUNTER — Inpatient Hospital Stay (HOSPITAL_COMMUNITY)
Admission: EM | Admit: 2014-09-11 | Discharge: 2014-09-14 | DRG: 292 | Disposition: A | Discharge status: Home / Self Care | Payer: Medicare Other | Attending: Internal Medicine | Admitting: Internal Medicine

## 2014-09-11 ENCOUNTER — Emergency Department (HOSPITAL_COMMUNITY): Payer: Medicare Other

## 2014-09-11 DIAGNOSIS — D638 Anemia in other chronic diseases classified elsewhere: Secondary | ICD-10-CM | POA: Diagnosis present

## 2014-09-11 DIAGNOSIS — D539 Nutritional anemia, unspecified: Secondary | ICD-10-CM | POA: Diagnosis present

## 2014-09-11 DIAGNOSIS — D6959 Other secondary thrombocytopenia: Secondary | ICD-10-CM | POA: Diagnosis present

## 2014-09-11 DIAGNOSIS — I129 Hypertensive chronic kidney disease with stage 1 through stage 4 chronic kidney disease, or unspecified chronic kidney disease: Secondary | ICD-10-CM | POA: Diagnosis present

## 2014-09-11 DIAGNOSIS — I350 Nonrheumatic aortic (valve) stenosis: Secondary | ICD-10-CM | POA: Diagnosis present

## 2014-09-11 DIAGNOSIS — Z23 Encounter for immunization: Secondary | ICD-10-CM | POA: Diagnosis not present

## 2014-09-11 DIAGNOSIS — Z8249 Family history of ischemic heart disease and other diseases of the circulatory system: Secondary | ICD-10-CM | POA: Diagnosis not present

## 2014-09-11 DIAGNOSIS — K7460 Unspecified cirrhosis of liver: Secondary | ICD-10-CM | POA: Diagnosis present

## 2014-09-11 DIAGNOSIS — N183 Chronic kidney disease, stage 3 unspecified: Secondary | ICD-10-CM | POA: Diagnosis present

## 2014-09-11 DIAGNOSIS — I10 Essential (primary) hypertension: Secondary | ICD-10-CM | POA: Diagnosis not present

## 2014-09-11 DIAGNOSIS — Z809 Family history of malignant neoplasm, unspecified: Secondary | ICD-10-CM | POA: Diagnosis not present

## 2014-09-11 DIAGNOSIS — R0602 Shortness of breath: Secondary | ICD-10-CM | POA: Diagnosis present

## 2014-09-11 DIAGNOSIS — N179 Acute kidney failure, unspecified: Secondary | ICD-10-CM | POA: Diagnosis present

## 2014-09-11 DIAGNOSIS — Z7189 Other specified counseling: Secondary | ICD-10-CM

## 2014-09-11 DIAGNOSIS — J45909 Unspecified asthma, uncomplicated: Secondary | ICD-10-CM | POA: Diagnosis present

## 2014-09-11 DIAGNOSIS — N184 Chronic kidney disease, stage 4 (severe): Secondary | ICD-10-CM | POA: Diagnosis present

## 2014-09-11 DIAGNOSIS — Z515 Encounter for palliative care: Secondary | ICD-10-CM | POA: Diagnosis not present

## 2014-09-11 DIAGNOSIS — I5033 Acute on chronic diastolic (congestive) heart failure: Secondary | ICD-10-CM

## 2014-09-11 DIAGNOSIS — I509 Heart failure, unspecified: Secondary | ICD-10-CM

## 2014-09-11 DIAGNOSIS — I5031 Acute diastolic (congestive) heart failure: Secondary | ICD-10-CM

## 2014-09-11 DIAGNOSIS — M069 Rheumatoid arthritis, unspecified: Secondary | ICD-10-CM | POA: Diagnosis present

## 2014-09-11 DIAGNOSIS — Z8701 Personal history of pneumonia (recurrent): Secondary | ICD-10-CM | POA: Diagnosis not present

## 2014-09-11 HISTORY — DX: Adverse effect of unspecified anesthetic, initial encounter: T41.45XA

## 2014-09-11 HISTORY — DX: Nausea with vomiting, unspecified: R11.2

## 2014-09-11 HISTORY — DX: Prediabetes: R73.03

## 2014-09-11 HISTORY — DX: Other specified postprocedural states: Z98.890

## 2014-09-11 HISTORY — DX: Cardiac murmur, unspecified: R01.1

## 2014-09-11 HISTORY — DX: Other complications of anesthesia, initial encounter: T88.59XA

## 2014-09-11 LAB — COMPREHENSIVE METABOLIC PANEL
ALT: 17 U/L (ref 14–54)
AST: 26 U/L (ref 15–41)
Albumin: 2.3 g/dL — ABNORMAL LOW (ref 3.5–5.0)
Alkaline Phosphatase: 79 U/L (ref 38–126)
Anion gap: 7 (ref 5–15)
BUN: 29 mg/dL — AB (ref 6–20)
CHLORIDE: 111 mmol/L (ref 101–111)
CO2: 22 mmol/L (ref 22–32)
CREATININE: 2.72 mg/dL — AB (ref 0.44–1.00)
Calcium: 8.3 mg/dL — ABNORMAL LOW (ref 8.9–10.3)
GFR calc Af Amer: 17 mL/min — ABNORMAL LOW (ref >60)
GFR, EST NON AFRICAN AMERICAN: 15 mL/min — AB (ref >60)
Glucose, Bld: 140 mg/dL — ABNORMAL HIGH (ref 65–99)
Potassium: 4.7 mmol/L (ref 3.5–5.1)
Sodium: 140 mmol/L (ref 135–145)
Total Bilirubin: 1.5 mg/dL — ABNORMAL HIGH (ref 0.3–1.2)
Total Protein: 5.7 g/dL — ABNORMAL LOW (ref 6.5–8.1)

## 2014-09-11 LAB — CBC WITH DIFFERENTIAL/PLATELET
Basophils Absolute: 0.1 10*3/uL (ref 0.0–0.1)
Basophils Relative: 2 % — ABNORMAL HIGH (ref 0–1)
EOS PCT: 0 % (ref 0–5)
Eosinophils Absolute: 0 10*3/uL (ref 0.0–0.7)
HCT: 24.9 % — ABNORMAL LOW (ref 36.0–46.0)
Hemoglobin: 8.1 g/dL — ABNORMAL LOW (ref 12.0–15.0)
LYMPHS ABS: 0.6 10*3/uL — AB (ref 0.7–4.0)
LYMPHS PCT: 11 % — AB (ref 12–46)
MCH: 33.9 pg (ref 26.0–34.0)
MCHC: 32.5 g/dL (ref 30.0–36.0)
MCV: 104.2 fL — AB (ref 78.0–100.0)
MONO ABS: 1.1 10*3/uL — AB (ref 0.1–1.0)
MONOS PCT: 20 % — AB (ref 3–12)
Neutro Abs: 3.6 10*3/uL (ref 1.7–7.7)
Neutrophils Relative %: 67 % (ref 43–77)
PLATELETS: 92 10*3/uL — AB (ref 150–400)
RBC: 2.39 MIL/uL — AB (ref 3.87–5.11)
RDW: 17.3 % — AB (ref 11.5–15.5)
WBC: 5.4 10*3/uL (ref 4.0–10.5)

## 2014-09-11 LAB — I-STAT TROPONIN, ED: Troponin i, poc: 0.03 ng/mL (ref 0.00–0.08)

## 2014-09-11 LAB — BRAIN NATRIURETIC PEPTIDE: B Natriuretic Peptide: 735.1 pg/mL — ABNORMAL HIGH (ref 0.0–100.0)

## 2014-09-11 LAB — AMMONIA: AMMONIA: 62 umol/L — AB (ref 9–35)

## 2014-09-11 MED ORDER — HEPARIN SODIUM (PORCINE) 5000 UNIT/ML IJ SOLN
5000.0000 [IU] | Freq: Three times a day (TID) | INTRAMUSCULAR | Status: DC
  Administered 2014-09-11 – 2014-09-14 (×8): 5000 [IU] via SUBCUTANEOUS
  Filled 2014-09-11 (×8): qty 1

## 2014-09-11 MED ORDER — FUROSEMIDE 10 MG/ML IJ SOLN
60.0000 mg | Freq: Once | INTRAMUSCULAR | Status: AC
  Administered 2014-09-11: 60 mg via INTRAVENOUS
  Filled 2014-09-11: qty 6

## 2014-09-11 MED ORDER — FUROSEMIDE 10 MG/ML IJ SOLN
40.0000 mg | Freq: Every day | INTRAMUSCULAR | Status: DC
  Administered 2014-09-12: 40 mg via INTRAVENOUS
  Filled 2014-09-11: qty 4

## 2014-09-11 MED ORDER — SPIRONOLACTONE 50 MG PO TABS
100.0000 mg | ORAL_TABLET | Freq: Every morning | ORAL | Status: DC
  Administered 2014-09-11 – 2014-09-14 (×4): 100 mg via ORAL
  Filled 2014-09-11 (×4): qty 2

## 2014-09-11 MED ORDER — ASPIRIN EC 81 MG PO TBEC
81.0000 mg | DELAYED_RELEASE_TABLET | Freq: Every day | ORAL | Status: DC
  Administered 2014-09-11 – 2014-09-13 (×3): 81 mg via ORAL
  Filled 2014-09-11 (×3): qty 1

## 2014-09-11 MED ORDER — SODIUM CHLORIDE 0.9 % IV SOLN: 250.0000 mL | INTRAVENOUS | Status: DC | PRN

## 2014-09-11 MED ORDER — SODIUM CHLORIDE 0.9 % IJ SOLN: 3.0000 mL | INTRAMUSCULAR | Status: DC | PRN

## 2014-09-11 MED ORDER — ACETAMINOPHEN 325 MG PO TABS: 650.0000 mg | ORAL_TABLET | ORAL | Status: DC | PRN

## 2014-09-11 MED ORDER — INFLUENZA VAC SPLIT QUAD 0.5 ML IM SUSY
0.5000 mL | PREFILLED_SYRINGE | INTRAMUSCULAR | Status: AC
  Administered 2014-09-12: 0.5 mL via INTRAMUSCULAR
  Filled 2014-09-11: qty 0.5

## 2014-09-11 MED ORDER — SODIUM CHLORIDE 0.9 % IJ SOLN
3.0000 mL | Freq: Two times a day (BID) | INTRAMUSCULAR | Status: DC
Start: 2014-09-11 — End: 2014-09-14
  Administered 2014-09-11 – 2014-09-14 (×6): 3 mL via INTRAVENOUS

## 2014-09-11 MED ORDER — ONDANSETRON HCL 4 MG/2ML IJ SOLN: 4.0000 mg | Freq: Four times a day (QID) | INTRAMUSCULAR | Status: DC | PRN

## 2014-09-11 NOTE — Consult Note (Signed)
CARDIOLOGY CONSULT NOTE   Patient ID: Jennifer Neal MRN: [id number] DOB/AGE: 05-31-1927 81 y.o.  Admit date: 09/11/2014  Primary Physician   Delorse Lek, MD Primary Cardiologist   Dr. Swaziland  Reason for Consultation  SOB/weight gain  HPI: Jennifer Neal is a 81 y.o. female with a history of severe AS, chronic diastolic CHF, HTN, cirrhosis who presented to [facility] today with SOB and weight gain after her diuretics were cut back.   She has a history of diastolic CHF as well as severe aortic stenosis by most recent echo in March 2015. She was not felt to be a candidate for open aortic valve replacement and was also felt to be a poor candidate for TAVR secondary to multiple comorbidities including cirrhosis. She also has a history of anemia and is on Procrit injections and CKD.  She was recently seen by Dr. Swaziland on 09/01/2014 at which time her Lasix dose was decreased from 40 mg by mouth twice a day to 40 mg by mouth daily. Her creatinine was 2.31 on 09/01/2014 that increased to 2.9 on 09/08/2014. She presents to the emergency department with complaints of Shortness of breath, orthopnea, 8 pound weight gain over the past week and increasing bilateral extremity pitting edema. She was given 60 mg of IV Lasix in the emergency department. She currently resides in the community with family members. Lab work in the emergency department showed a BNP of 735 with creatinine of 2.72 and BUN of 29. A chest x-ray revealed pulmonary interstitial edema.     Past Medical History  Diagnosis Date  . Essential hypertension   . Severe aortic stenosis     a. 03/2013 Echo: EF 55-60%, mod LVH, Severe Ao Stenosis, mild MR, mildly dil LA, PASP .  Marland Kitchen Rheumatoid arthritis(714.0)   . Asthma   . Hepatic cirrhosis   . Anemia of chronic disease     a. Regular Procrit injections.  . CKD (chronic kidney disease), stage III   . History of pneumonia Dec 2012  . Diastolic CHF, chronic     a. 03/2013  Echo: EF 55-60%, mod LVH, Severe Ao Stenosis, mild MR, mildly dil LA, PASP .  Marland Kitchen Resting tremor   . Lower extremity edema      Past Surgical History  Procedure Laterality Date  . Abdominal hysterectomy    . Breast lumpectomy      right    Allergies  Allergen Reactions  . Penicillins Swelling  . Sulfa Drugs Cross Reactors Rash  . Codeine Nausea And Vomiting    I have reviewed the patient's current medications     Prior to Admission medications   Medication Sig Start Date End Date Taking? Authorizing Provider  amLODipine (NORVASC) 5 MG tablet Take 2.5 mg by mouth daily.   Yes Historical Provider, MD  aspirin 81 MG EC tablet Take 81 mg by mouth at bedtime.    Yes Historical Provider, MD  ferrous sulfate 325 (65 FE) MG tablet Take 325 mg by mouth 2 (two) times daily with a meal.   Yes Historical Provider, MD  furosemide (LASIX) 40 MG tablet Take 40 mg by mouth daily. 08/27/14  Yes Peter M Swaziland, MD  lisinopril (PRINIVIL,ZESTRIL) 20 MG tablet TAKE 1 TABLET BY MOUTH TWICE A DAY 05/06/14  Yes Peter M Swaziland, MD  spironolactone (ALDACTONE) 25 MG tablet Take 100 mg by mouth every morning.    Yes Historical Provider, MD  trolamine salicylate (ASPERCREME) 10 % cream  Apply 1 application topically as needed for muscle pain.   Yes Historical Provider, MD  epoetin alfa (EPOGEN,PROCRIT) 84665 UNIT/ML injection Inject 40,000 Units into the skin every 14 (fourteen) days.    Historical Provider, MD     Social History   Social History  . Marital Status: Widowed    Spouse Name: N/A  . Number of Children: 7  . Years of Education: N/A   Occupational History  . retired    Social History Main Topics  . Smoking status: Never Smoker   . Smokeless tobacco: Not on file  . Alcohol Use: No  . Drug Use: No  . Sexual Activity: Not on file   Other Topics Concern  . Not on file   Social History Narrative    Family Status  Relation Status Death Age  . Father Deceased 40    unknown  causes  . Mother Deceased 80  . Brother Deceased 53    heart attack  . Brother Deceased 35    aortic aneurysm  . Sister Deceased 48    cancer  . Sister Deceased 107    cancer  . Sister Deceased ?    cancer  . Sister Alive   . Sister Alive   . Son Alive   . Son Alive   . Daughter Alive   . Daughter Alive    Family History  Problem Relation Age of Onset  . Heart attack Brother   . Cancer Brother   . Cancer Brother   . Aortic aneurysm Brother   . Cancer Sister   . Cancer Sister   . Cancer Sister   . Goiter Mother      ROS:  Full 14 point review of systems complete and found to be negative unless listed above.  Physical Exam: Blood pressure 125/46, pulse 84, temperature 98.1 F (36.7 C), temperature source Oral, resp. rate 24, weight 172 lb 6.4 oz (78.2 kg), SpO2 96 %.  General: Well developed, well nourished, female in no acute distress Head: Eyes PERRLA, No xanthomas.   Normocephalic and atraumatic, oropharynx without edema or exudate.  Lungs: crackles at bases Heart: + SEM @ RUSB. HRRR S1 S2, no rub/gallop, Heart reg rate and rhythm with S1, S2  murmur. pulses are 2+ extrem.   Neck: No carotid bruits. No lymphadenopathy. + JVD. Abdomen: Bowel sounds present, abdomen soft and non-tender without masses or hernias noted. Msk:  No spine or cva tenderness. No weakness, no joint deformities or effusions. Extremities: No clubbing or cyanosis. 2+ bilateral LE edema.  Neuro: Alert and oriented X 3. No focal deficits noted. Psych:  Good affect, responds appropriately Skin: No rashes or lesions noted.  Labs:   Lab Results  Component Value Date   WBC 5.4 09/11/2014   HGB 8.1* 09/11/2014   HCT 24.9* 09/11/2014   MCV 104.2* 09/11/2014   PLT 92* 09/11/2014   No results for input(s): INR in the last 72 hours.  Recent Labs Lab 09/11/14 1103  NA 140  K 4.7  CL 111  CO2 22  BUN 29*  CREATININE 2.72*  CALCIUM 8.3*  PROT 5.7*  BILITOT 1.5*  ALKPHOS 79  ALT 17  AST 26   GLUCOSE 140*  ALBUMIN 2.3*   MAGNESIUM  Date Value Ref Range Status  06/02/2014 1.7 1.7 - 2.4 mg/dL Final     Recent Labs  09/11/14 1113  TROPIPOC 0.03    FERRITIN  Date/Time Value Ref Range Status  09/08/2014 10:54 AM 600*  11 - 307 ng/mL Final   TIBC  Date/Time Value Ref Range Status  09/08/2014 10:54 AM 134* 250 - 450 ug/dL Final   IRON  Date/Time Value Ref Range Status  09/08/2014 10:54 AM 104 28 - 170 ug/dL Final    Echo: 83/15/1761 LV EF: 55% -  60% Study Conclusions - Left ventricle: The cavity size was normal. Wall thickness was increased in a pattern of moderate LVH. Systolic function was normal. The estimated ejection fraction was in the range of 55% to 60%. - Aortic valve: There was severe stenosis. - Mitral valve: Severe subvalvular calcification with rheumatic looking valve. No MS Mild regurgitation. - Left atrium: The atrium was mildly dilated. - Atrial septum: No defect or patent foramen ovale was identified. - Pulmonary arteries: PA peak pressure: 61mm Hg (S).  ECG:  HR 63 NSR  Radiology:  Dg Chest 2 View  09/11/2014   CLINICAL DATA:  81 year old female with shortness of Breath acute onset this morning. Initial encounter.  EXAM: CHEST  2 VIEW  COMPARISON:  11/24/2013 and earlier.  FINDINGS: Semi upright AP and lateral views of the chest. Chronic moderate to severe cardiomegaly appears stable. Calcified atherosclerosis of the aorta. Other mediastinal contours are within normal limits. Increased pleural fluid, primarily in the fissures. Increased interstitial markings diffusely. No pneumothorax. No consolidation. Osteopenia. No acute osseous abnormality identified.  IMPRESSION: Pulmonary interstitial edema with small pleural effusions. Chronic cardiomegaly. Calcified aortic atherosclerosis.   Electronically Signed   By: Odessa Fleming M.D.   On: 09/11/2014 11:35    ASSESSMENT AND PLAN:    Principal Problem:   Acute on chronic diastolic CHF  (congestive heart failure) Active Problems:   Aortic stenosis, severe   Hepatic cirrhosis   CKD (chronic kidney disease), stage III   Essential hypertension   Anemia of chronic disease   Acute heart failure  Jennifer Neal is a 81 y.o. female with a history of severe AS, chronic diastolic CHF, HTN, cirrhosis who presented to [facility] today with SOB and weight gain after her diuretics were cut back.   Acute on chronic diastolic congestive heart failure/lower extremity edema: in the setting of severe aortic stenosis and hepatic cirrhosis. -- BNP 735, CXR w/ interstitial edema and bilateral pleural effusions. Her weight is up 8 lbs since 09/01/14 and s/s CHF -- Her lasix was recently cut back from 40mg  BID to 40 qd. Given 60mg  IV lasix in ED and already negative 475 mL.  Severe aortic stenosis: Due to comorbidities, she is not felt to be a candidate for SAVR or TAVR.    Essential hypertension: Stable.  Hepatic cirrhosis: This is followed by Dr. and is certainly playing a role in her lower extremity edema.  Stage III CKD: creat worsened from 2.3 to 2.7 despite cutting back of diuretics dose -- Her ACE has been discontinued   Anemia- cont procrit  Dispo- palliative care has been consulted and the patient and her family have been informed about her poor condition.  Signed , PA-C 09/11/2014 2:04 PM  Pager Janetta Hora  Co-Sign MD  I have seen and examined the patient along with 11/11/2014, PA-C.  I have reviewed the chart, notes and new data.  I agree with PA's note.  Key new complaints: improved dyspnea, not yet back to baseline; moderate bilateral pitting edema;roughly 6lb above 8/23 weight when lasix was decreased Key examination changes: very weak, low volume carotid pulses, late peaking AS murmur;moderate bilateral pitting edema;roughly 6lb  above 8/23 weight when lasix was decreased; Key new findings / data: creatinine trend noted  PLAN: Mrs.  Neal has end stage severe aortic stenosis and is, unfortunately, not a candidate for SAVR/TAVR and fluid management is complicated by advanced chronic kidney disease and anemia I would focus on relief of symptoms, especially dyspnea. I think we should no longer focus on renal function parameters. I have recommended a palliative care consultation. Continue diuretics (avoiding rapid fluid removal that could lead to hypotension) until dyspnea returns to previous baseline, irregardless of renal function. Probably need to permanently DC lisinopril, also maybe DC amlodipine. She is not receiving either agent right now.  Thurmon Fair, MD, [facility] [phone] 09/11/2014, 3:19 PM

## 2014-09-11 NOTE — Progress Notes (Signed)
Family members refuse bed alarm while they are at bedside.

## 2014-09-11 NOTE — ED Notes (Signed)
RN unable to obtain access, another RN to attempt.

## 2014-09-11 NOTE — ED Notes (Signed)
Patient transported to X-ray 

## 2014-09-11 NOTE — ED Provider Notes (Signed)
CSN: [id number]     Arrival date & time 09/11/14  1025 History   First MD Initiated Contact with Patient 09/11/14 1026     Chief Complaint  Patient presents with  . Shortness of Breath  . Abdominal Pain   81 yo F h/o severe aortic stenosis, no surgical repair, cirrhosis, and anemia of chronic disease presented with subacute onset of shortness of breath, worsening lower extremity edema, and right upper quadrant abdominal pain.  She was seen by her cardiologist last week and her Lasix was decreased from 40mg  BID to 40mg  daily and her edema and shortness of breath has worsened since then. Her abdominal pain started more abruptly this morning; she describes the pain as achy and worse when she takes a deep breath. She felt a little nauseous but has been eating well, her last bowel movement was this morning, she hasn't had any change in the color or consistency of her stools. She denies any radiation of pain to her back or arms, dizziness, or diaphoresis.  Patient is a 81 y.o. female presenting with shortness of breath and abdominal pain.  Shortness of Breath Associated symptoms: abdominal pain   Associated symptoms: no chest pain, no cough, no fever, no vomiting and no wheezing   Abdominal Pain Associated symptoms: nausea and shortness of breath   Associated symptoms: no chest pain, no constipation, no cough, no diarrhea, no dysuria, no fever and no vomiting     Past Medical History  Diagnosis Date  . Essential hypertension   . Severe aortic stenosis     a. 03/2013 Echo: EF 55-60%, mod LVH, Severe Ao Stenosis, mild MR, mildly dil LA, PASP 97.  04/2013 Rheumatoid arthritis(714.0)   . Asthma   . Hepatic cirrhosis   . Anemia of chronic disease     a. Regular Procrit injections.  . CKD (chronic kidney disease), stage III   . History of pneumonia Dec 2012  . Diastolic CHF, chronic     a. 03/2013 Echo: EF 55-60%, mod LVH, Severe Ao Stenosis, mild MR, mildly dil LA, PASP Jan 2013.  04/2013 Resting tremor    . Lower extremity edema    Past Surgical History  Procedure Laterality Date  . Abdominal hysterectomy    . Breast lumpectomy      right   Family History  Problem Relation Age of Onset  . Heart attack Brother   . Cancer Brother   . Cancer Brother   . Aortic aneurysm Brother   . Cancer Sister   . Cancer Sister   . Cancer Sister   . Goiter Mother    Social History  Substance Use Topics  . Smoking status: Never Smoker   . Smokeless tobacco: None  . Alcohol Use: No   OB History    No data available     Review of Systems  Constitutional: Negative for fever.  Respiratory: Positive for shortness of breath. Negative for cough, chest tightness and wheezing.   Cardiovascular: Positive for leg swelling. Negative for chest pain and palpitations.  Gastrointestinal: Positive for nausea and abdominal pain. Negative for vomiting, diarrhea, constipation, blood in stool and abdominal distention.  Genitourinary: Negative for dysuria, flank pain and difficulty urinating.  Neurological: Negative for dizziness, light-headedness and numbness.    Allergies  Penicillins; Sulfa drugs cross reactors; and Codeine  Home Medications   Prior to Admission medications   Medication Sig Start Date End Date Taking? Authorizing Provider  amLODipine (NORVASC) 5 MG tablet Take 2.5 mg by mouth  daily.    Historical Provider, MD  aspirin 81 MG EC tablet Take 81 mg by mouth at bedtime.     Historical Provider, MD  epoetin alfa (EPOGEN,PROCRIT) 16109 UNIT/ML injection Inject 40,000 Units into the skin every 14 (fourteen) days.    Historical Provider, MD  ferrous sulfate 325 (65 FE) MG tablet Take 325 mg by mouth 2 (two) times daily with a meal.    Historical Provider, MD  furosemide (LASIX) 40 MG tablet Take 40 mg by mouth daily. 08/27/14   Peter M Swaziland, MD  lisinopril (PRINIVIL,ZESTRIL) 20 MG tablet TAKE 1 TABLET BY MOUTH TWICE A DAY 05/06/14   Peter M Swaziland, MD  spironolactone (ALDACTONE) 25 MG tablet  Take 100 mg by mouth every morning.     Historical Provider, MD  trolamine salicylate (ASPERCREME) 10 % cream Apply 1 application topically as needed for muscle pain.    Historical Provider, MD   BP 132/45 mmHg  Pulse 70  Temp(Src) 98.1 F (36.7 C) (Oral)  Resp 21  SpO2 96%   Physical Exam  Constitutional: No distress.  HENT:  Head: Normocephalic and atraumatic.  Eyes: Conjunctivae are normal.  Neck: Neck supple.  Cardiovascular: Regular rhythm, S1 normal and S2 normal.  Bradycardia present.   Murmur heard.  Systolic murmur is present with a grade of 4/6  Pulses:      Radial pulses are 2+ on the right side, and 2+ on the left side.  Pulmonary/Chest: No accessory muscle usage. No respiratory distress. She has no wheezes. She has rales in the right lower field and the left lower field. She exhibits no tenderness.  Abdominal: She exhibits no distension, no ascites and no mass. There is no tenderness. There is no rigidity and no guarding.  Neurological: No cranial nerve deficit.  Resting tremor but no asterixis  Skin: She is not diaphoretic.    ED Course  Procedures (including critical care time)  Labs Review Labs Reviewed  CBC WITH DIFFERENTIAL/PLATELET  BRAIN NATRIURETIC PEPTIDE  AMMONIA  COMPREHENSIVE METABOLIC PANEL  I-STAT TROPOININ, ED    Imaging Review No results found. I have personally reviewed and evaluated these images and lab results as part of my medical decision-making.   EKG Interpretation   Date/Time:  Friday September 11 2014 10:27:13 EDT Ventricular Rate:  63 PR Interval:  151 QRS Duration: 90 QT Interval:  468 QTC Calculation: 479 R Axis:   16 Text Interpretation:  Sinus rhythm No significant change since last  tracing Confirmed by Denton Lank  MD, Caryn Bee ([id number]) on 09/11/2014 10:45:12 AM      MDM   Final diagnoses:  None    81 yo F h/o severe aortic stenosis, congestive heart failure, cirrhosis, CKD3, and anemia of chronic disease presents with  increasing shortness of breath, edema, and abdominal pain in the setting of volume overload after lowering the dose of her diuretic last week from lasix 40mg  BID to 40mg  daily. She has numerous reasons to be fluid overloaded including: CHF, cirrhosis, and CKD. Per cardiology notes, she is not a candidate for TAVR. I think her abdominal pain is from mesenteric congestion from her cirrhosis and CHF; she is afebrile and her abdominal exam is benign so more acute problems such as SBP, cholecystis, and pancreatitis are much lower on the differential. She was fluid overloaded on exam, her chest x-ray was notable for meniscus sign and pulmonary edema, her creatinine was above baseline at 2.7 (baseline 2.0ish). She was given 60mg  IV lasix in  the ED and [facility] hospitalists were called for admission for diuresis and further management.   Selina Cooley, MD 09/11/14 1328  Cathren Laine, MD 09/11/14 1330

## 2014-09-11 NOTE — ED Notes (Signed)
Pt presents via [facility] for c/o shortness of breath and abdominal pain.  Hx: CHF, DM2, cirrhosis of liver.  Pt also with BL LE edema and mildly distended abdomen.  Pt reports decrease in "fluid" pill the last couple weeks.  Pt speaking in full, complete sentences.  BP-136/60 P-60 NSR R-16/18 O2-98% 3L.  Pt a x 4, NAD, family at bedside.

## 2014-09-11 NOTE — ED Notes (Signed)
MD at bedside. 

## 2014-09-11 NOTE — H&P (Signed)
[facility] History and Physical  Jennifer Neal:[id number] DOB: Oct 27, 1927 DOA: 09/11/2014  Referring physician:  PCP: Delorse Lek, MD   Chief Complaint: Shortness of breath/weight gain  HPI: Jennifer Neal is a 81 y.o. female with a past medical history of severe aortic stenosis, chronic diastolic congestive heart failure, cirrhosis, hypertension,  Is any to the emergency department with complaints of shortness of breath. She recently saw her cardiologist Dr. Swaziland on 09/01/2014 at which time her Lasix dose was decreased from 40 mg by mouth twice a day to 40 mg by mouth daily. Her creatinine was 2.31 on 09/01/2014 that increased to 2.9 on 09/08/2014. She presents to the emergency department with complaints of Shortness of breath, orthopnea, 8 pound weight gain over the past week and increasing bilateral extremity pitting edema. She was given 60 mg of IV Lasix in the emergency department. She currently resides in the community with family members. Lab work in the emergency department showed a BNP of 735 with creatinine of 2.72 and BUN of 29. A chest x-ray revealed pulmonary interstitial edema.                                                           Review of Systems:  Constitutional:  No weight loss, night sweats, Fevers, chills, fatigue.  HEENT:  No headaches, Difficulty swallowing,Tooth/dental problems,Sore throat,  No sneezing, itching, ear ache, nasal congestion, post nasal drip,  Cardio-vascular:  No chest pain, Orthopnea, PND,  positive for swelling in lower extremities, anasarca, dizziness, palpitations  GI:  No heartburn, indigestion, abdominal pain, nausea, vomiting, diarrhea, change in bowel habits, loss of appetite  Resp:  Positive for rtness of breath with exertion or at rest. No excess mucus, no productive cough, No non-productive cough, No coughing up of blood.No change in color of mucus.No wheezing.No chest wall deformity  Skin:  no rash or lesions.    GU:  no dysuria, change in color of urine, no urgency or frequency. No flank pain.  Musculoskeletal:  No joint pain or swelling. No decreased range of motion. No back pain.  Psych:  No change in mood or affect. No depression or anxiety. No memory loss.   Past Medical History  Diagnosis Date  . Essential hypertension   . Severe aortic stenosis     a. 03/2013 Echo: EF 55-60%, mod LVH, Severe Ao Stenosis, mild MR, mildly dil LA, PASP .  Marland Kitchen Rheumatoid arthritis(714.0)   . Asthma   . Hepatic cirrhosis   . Anemia of chronic disease     a. Regular Procrit injections.  . CKD (chronic kidney disease), stage III   . History of pneumonia Dec 2012  . Diastolic CHF, chronic     a. 03/2013 Echo: EF 55-60%, mod LVH, Severe Ao Stenosis, mild MR, mildly dil LA, PASP .  Marland Kitchen Resting tremor   . Lower extremity edema    Past Surgical History  Procedure Laterality Date  . Abdominal hysterectomy    . Breast lumpectomy      right   Social History:  reports that she has never smoked. She does not have any smokeless tobacco history on file. She reports that she does not drink alcohol or use illicit drugs.  Allergies  Allergen Reactions  . Penicillins Swelling  . Sulfa Drugs Cross  Reactors Rash  . Codeine Nausea And Vomiting    Family History  Problem Relation Age of Onset  . Heart attack Brother   . Cancer Brother   . Cancer Brother   . Aortic aneurysm Brother   . Cancer Sister   . Cancer Sister   . Cancer Sister   . Goiter Mother     Prior to Admission medications   Medication Sig Start Date End Date Taking? Authorizing Provider  amLODipine (NORVASC) 5 MG tablet Take 2.5 mg by mouth daily.    Historical Provider, MD  aspirin 81 MG EC tablet Take 81 mg by mouth at bedtime.     Historical Provider, MD  epoetin alfa (EPOGEN,PROCRIT) 09381 UNIT/ML injection Inject 40,000 Units into the skin every 14 (fourteen) days.    Historical Provider, MD  ferrous sulfate 325 (65 FE) MG tablet  Take 325 mg by mouth 2 (two) times daily with a meal.    Historical Provider, MD  furosemide (LASIX) 40 MG tablet Take 40 mg by mouth daily. 08/27/14   Peter M Swaziland, MD  lisinopril (PRINIVIL,ZESTRIL) 20 MG tablet TAKE 1 TABLET BY MOUTH TWICE A DAY 05/06/14   Peter M [place], MD  spironolactone (ALDACTONE) 25 MG tablet Take 100 mg by mouth every morning.     Historical Provider, MD  trolamine salicylate (ASPERCREME) 10 % cream Apply 1 application topically as needed for muscle pain.    Historical Provider, MD   Physical Exam: Filed Vitals:   09/11/14 1230 09/11/14 1245 09/11/14 1255 09/11/14 1300  BP: 124/45 117/51  125/49  Pulse: 82 83  83  Temp:      TempSrc:      Resp: 23 28  31   Weight:   78.2 kg (172 lb 6.4 oz)   SpO2: 98% 98%  96%    Wt Readings from Last 3 Encounters:  09/11/14 78.2 kg (172 lb 6.4 oz)  09/01/14 74.39 kg (164 lb)  07/09/14 80.015 kg (176 lb 6.4 oz)    General:  Appears calm and comfortable, she is in no acute distress. Eyes: PERRL, normal lids, irises & conjunctiva ENT: grossly normal hearing, lips & tongue Neck: no LAD, masses or thyromegaly, positive jugular venous distention  Cardiovascular: RRR. She has a 3-6 systolic ejection murmur that radiates to the carotids. She also has a positive hepatojugular reflux and 2-3+ bilateral lower extremity pitting edema   Respiratory: She has pressed bibasilar crackles, no wheezing or rhonchi.  Abdomen: soft, ntnd Skin: no rash or induration seen on limited exam Musculoskeletal: grossly normal tone BUE/BLE, 2-3 + bilateral extremity pitting edema  Psychiatric: grossly normal mood and affect, speech fluent and appropriate Neurologic: grossly non-focal.          Labs on Admission:  Basic Metabolic Panel:  Recent Labs Lab 09/08/14 1054 09/08/14 1055 09/11/14 1103  NA  --  142 140  K  --  5.2* 4.7  CL  --  111 111  CO2  --  26 22  GLUCOSE  --  161* 140*  BUN  --  32* 29*  CREATININE  --  2.90* 2.72*    CALCIUM 8.0* 8.2* 8.3*  PHOS  --  4.1  --    Liver Function Tests:  Recent Labs Lab 09/08/14 1055 09/11/14 1103  AST  --  26  ALT  --  17  ALKPHOS  --  79  BILITOT  --  1.5*  PROT  --  5.7*  ALBUMIN 2.2* 2.3*  No results for input(s): LIPASE, AMYLASE in the last 168 hours.  Recent Labs Lab 09/11/14 1103  AMMONIA 62*   CBC:  Recent Labs Lab 09/08/14 1044 09/11/14 1103  WBC  --  5.4  NEUTROABS  --  3.6  HGB 7.8* 8.1*  HCT  --  24.9*  MCV  --  104.2*  PLT  --  92*   Cardiac Enzymes: No results for input(s): CKTOTAL, CKMB, CKMBINDEX, TROPONINI in the last 168 hours.  BNP (last 3 results)  Recent Labs  09/11/14 1103  BNP 735.1*    ProBNP (last 3 results)  Recent Labs  12/01/13 1225  PROBNP 1821.00*    CBG: No results for input(s): GLUCAP in the last 168 hours.  Radiological Exams on Admission: Dg Chest 2 View  09/11/2014   CLINICAL DATA:  81 year old female with shortness of Breath acute onset this morning. Initial encounter.  EXAM: CHEST  2 VIEW  COMPARISON:  11/24/2013 and earlier.  FINDINGS: Semi upright AP and lateral views of the chest. Chronic moderate to severe cardiomegaly appears stable. Calcified atherosclerosis of the aorta. Other mediastinal contours are within normal limits. Increased pleural fluid, primarily in the fissures. Increased interstitial markings diffusely. No pneumothorax. No consolidation. Osteopenia. No acute osseous abnormality identified.  IMPRESSION: Pulmonary interstitial edema with small pleural effusions. Chronic cardiomegaly. Calcified aortic atherosclerosis.   Electronically Signed   By: Odessa Fleming M.D.   On: 09/11/2014 11:35    EKG: Independently reviewed.   Assessment/Plan Principal Problem:   Acute on chronic diastolic CHF (congestive heart failure) Active Problems:   Aortic stenosis, severe   Hepatic cirrhosis   CKD (chronic kidney disease), stage III   Essential hypertension   Anemia of chronic disease   Acute  heart failure   1. Acute on chronic diastolic congestive heart failure. Patient with a history of severe aortic stenosis, having last transthoracic echocardiogram performed on 03/19/2013 that showed a preserved ejection fraction of 55-60%. Her diuretic dose was decreased from 40 mg twice a day to daily dosing about a week ago. Since then she has developed a pound weight gain associate with shortness of breath and extremity edema. She was given 60 mg of IV Lasix in the emergency department. Will continue IV diuresis with 40 mg IV daily. Cardiology has been consulted.  2. Severe aortic stenosis. She has a history of severe aortic stenosis was not candidate for  Surgical aortic valve replacement or TAVR due to multiple comorbidities including cirrhosis.  She has had increasing creatinine over the past week as there is concern for cardiorenal syndrome. She presents with acute decompensated heart failure and plan to diurese with 40 mg IV daily. Overall prognosis poor. Will consult palliative care to facilitate establishment of goals of care.  3. Acute on chronic renal failure. Patient having history of stage III chronic kidney disease having creatinine of 2.3 on 09/01/2014 increased to 2.7 on presenting lab work. She has a high risk of developing hepatorenal and cardiorenal syndrome.  Will monitor kidney function carefully as she is receiving IV Lasix for decompensated heart failure. Stopping ACE inhibitor therapy for now. Cardiology has been consulted.  4. Cirrhosis.  Last CT scan was performed on 03/04/2008 that showed findings suggestive of cirrhosis and portal hypertension. Study also revealed presence of ascites.  5. Hypertension. Due to worsening kidney function will discontinue ACE inhibitor therapy. Blood  pressures are stable on admission.  6. DVT prophylaxis. Subcutaneous heparin    Code Status: I discussed CODE STATUS  with patient and family members were present at bedside, she is not sure regarding  advanced her care directives, family would like to keep her full code until they have another discussion Family Communication: I spoke with her daughter and granddaughter at bedside  Disposition Plan: Will admit to the inpatient service, to facial require greater than 2 nights hospitalization  Time spent: 70 min  Jeralyn Bennett [facility] Pager [phone]

## 2014-09-11 NOTE — Progress Notes (Signed)
PT Cancellation Note  Patient Details Name: Jennifer Neal MRN: [id number] DOB: [birth year]   Cancelled Treatment:    Reason Eval/Treat Not Completed: Medical issues which prohibited therapy.  Patient is on bedrest per orders.  Also noted palliative care consult.  MD:  Please write activity orders when appropriate for patient.  PT will initiate evaluation at that time.   Thank you.   Vena Austria 09/11/2014, 5:25 PM Durenda Hurt. Renaldo Fiddler, [facility] Acute Rehab Services Pager [phone]

## 2014-09-12 DIAGNOSIS — K7460 Unspecified cirrhosis of liver: Secondary | ICD-10-CM

## 2014-09-12 DIAGNOSIS — I350 Nonrheumatic aortic (valve) stenosis: Secondary | ICD-10-CM

## 2014-09-12 DIAGNOSIS — I5033 Acute on chronic diastolic (congestive) heart failure: Principal | ICD-10-CM

## 2014-09-12 DIAGNOSIS — N179 Acute kidney failure, unspecified: Secondary | ICD-10-CM

## 2014-09-12 DIAGNOSIS — N184 Chronic kidney disease, stage 4 (severe): Secondary | ICD-10-CM

## 2014-09-12 LAB — BASIC METABOLIC PANEL
Anion gap: 6 (ref 5–15)
BUN: 27 mg/dL — ABNORMAL HIGH (ref 6–20)
CHLORIDE: 109 mmol/L (ref 101–111)
CO2: 26 mmol/L (ref 22–32)
CREATININE: 2.54 mg/dL — AB (ref 0.44–1.00)
Calcium: 8.3 mg/dL — ABNORMAL LOW (ref 8.9–10.3)
GFR calc non Af Amer: 16 mL/min — ABNORMAL LOW (ref >60)
GFR, EST AFRICAN AMERICAN: 19 mL/min — AB (ref >60)
Glucose, Bld: 85 mg/dL (ref 65–99)
POTASSIUM: 4.6 mmol/L (ref 3.5–5.1)
Sodium: 141 mmol/L (ref 135–145)

## 2014-09-12 LAB — CBC
HEMATOCRIT: 24.5 % — AB (ref 36.0–46.0)
Hemoglobin: 8 g/dL — ABNORMAL LOW (ref 12.0–15.0)
MCH: 34 pg (ref 26.0–34.0)
MCHC: 32.7 g/dL (ref 30.0–36.0)
MCV: 104.3 fL — AB (ref 78.0–100.0)
PLATELETS: 102 10*3/uL — AB (ref 150–400)
RBC: 2.35 MIL/uL — AB (ref 3.87–5.11)
RDW: 17.6 % — ABNORMAL HIGH (ref 11.5–15.5)
WBC: 5.7 10*3/uL (ref 4.0–10.5)

## 2014-09-12 LAB — GLUCOSE, CAPILLARY: Glucose-Capillary: 116 mg/dL — ABNORMAL HIGH (ref 65–99)

## 2014-09-12 MED ORDER — FUROSEMIDE 10 MG/ML IJ SOLN
40.0000 mg | Freq: Two times a day (BID) | INTRAMUSCULAR | Status: DC
  Administered 2014-09-12 – 2014-09-13 (×3): 40 mg via INTRAVENOUS
  Filled 2014-09-12 (×3): qty 4

## 2014-09-12 NOTE — Progress Notes (Signed)
SATURATION QUALIFICATIONS: (This note is used to comply with regulatory documentation for home oxygen)  Patient Saturations on Room Air at Rest = 92%  Patient Saturations on Room Air while Ambulating = 86%  Patient Saturations on 2 Liters of oxygen while Ambulating = 90%  Please briefly explain why patient needs home oxygen:Patient desats to 86% on room air on exertion.

## 2014-09-12 NOTE — Evaluation (Signed)
Physical Therapy Evaluation Patient Details Name: Jennifer Neal MRN: [id number] DOB: [birth year] Today's Date: 09/12/2014   History of Present Illness  Jennifer Neal is a 81 y.o. female with a past medical history of severe aortic stenosis, chronic diastolic congestive heart failure, cirrhosis, hypertension, Is any to the emergency department with complaints of shortness of breath.   Clinical Impression  Pt was functioning at supervision level PTA with no AD. Pt now requires minA, is deconditioned and requires use of RW for safe ambulation. SpO2 decreased during ambulation (see vitals section)    Follow Up Recommendations Home health PT;Supervision/Assistance - 24 hour    Equipment Recommendations  None recommended by PT    Recommendations for Other Services       Precautions / Restrictions Precautions Precautions: Fall Precaution Comments: SOB with mobility Restrictions Weight Bearing Restrictions: No   SATURATION QUALIFICATIONS: (This note is used to comply with regulatory documentation for home oxygen)  Patient Saturations on Room Air at Rest = 92%  Patient Saturations on Room Air while Ambulating = 86%   Please briefly explain why patient needs home oxygen: can't maintain > 88% on RA    Mobility  Bed Mobility Overal bed mobility: Needs Assistance Bed Mobility: Supine to Sit     Supine to sit: Min guard     General bed mobility comments: increaed time, long sit technique  Transfers Overall transfer level: Needs assistance Equipment used: 1 person hand held assist Transfers: Sit to/from [employer] Sit to Stand: Mod assist Stand pivot transfers: Mod assist       General transfer comment: pt deferred RW however very dependent on PT via HHA and shaky, recommended RW, pt and family agreed,  Ambulation/Gait Ambulation/Gait assistance: Min assist Ambulation Distance (Feet): 50 Feet Assistive device: Rolling walker (2 wheeled) Gait  Pattern/deviations: Step-through pattern;Decreased stride length Gait velocity: slow   General Gait Details: pt slow with on set of SOB, SpO2 decreased to 86% on RA  Stairs            Wheelchair Mobility    Modified Rankin (Stroke Patients Only)       Balance Overall balance assessment: Needs assistance Sitting-balance support: Feet supported;No upper extremity supported Sitting balance-Leahy Scale: Good     Standing balance support: Single extremity supported Standing balance-Leahy Scale: Poor Standing balance comment: pt shaky, unable to achieve full upright position, minA to maintain balance while performing pericare s/p urination                             Pertinent Vitals/Pain Pain Assessment: No/denies pain    Home Living Family/patient expects to be discharged to:: Private residence Living Arrangements: Children Available Help at Discharge: Family;Available 24 hours/day Type of Home: House Home Access: Level entry     Home Layout: One level Home Equipment: Grab bars - toilet;Grab bars - tub/shower;Shower seat;Walker - 4 wheels      Prior Function Level of Independence: Independent with assistive device(s)         Comments: uses cane and 4WW occasionally, was doing sponge baths     Hand Dominance   Dominant Hand: Right    Extremity/Trunk Assessment   Upper Extremity Assessment: Generalized weakness           Lower Extremity Assessment: Generalized weakness (noted edema R LE > L LE but L foot > R)      Cervical / Trunk Assessment: Normal  Communication  Communication: No difficulties  Cognition Arousal/Alertness: Awake/alert Behavior During Therapy: WFL for tasks assessed/performed Overall Cognitive Status: Within Functional Limits for tasks assessed                      General Comments      Exercises        Assessment/Plan    PT Assessment Patient needs continued PT services  PT Diagnosis  Generalized weakness;Difficulty walking   PT Problem List Decreased strength;Decreased activity tolerance;Decreased balance;Decreased mobility  PT Treatment Interventions DME instruction;Gait training;Functional mobility training;Therapeutic activities;Therapeutic exercise;Balance training   PT Goals (Current goals can be found in the Care Plan section) Acute Rehab PT Goals Patient Stated Goal: home PT Goal Formulation: With patient/family Time For Goal Achievement: 09/19/14 Potential to Achieve Goals: Good    Frequency Min 3X/week   Barriers to discharge        Co-evaluation               End of Session Equipment Utilized During Treatment: Gait belt Activity Tolerance: Patient limited by fatigue Patient left: in chair;with call bell/phone within reach;with family/visitor present Nurse Communication: Mobility status         Time: 0755-0820 PT Time Calculation (min) (ACUTE ONLY): 25 min   Charges:   PT Evaluation $Initial PT Evaluation Tier I: 1 Procedure PT Treatments $Gait Training: 8-22 mins   PT G CodesMarcene Neal 09/12/2014, 9:39 AM   Jennifer Neal, PT, DPT Pager #: [phone] Office #: [phone]

## 2014-09-12 NOTE — Progress Notes (Addendum)
PROGRESS NOTE  DERRICA SIEG ZJI:[id number] DOB: 1927/12/23 DOA: 09/11/2014 PCP: Delorse Lek, MD  Brief History 81 y.o. female with a past medical history of severe aortic stenosis, chronic diastolic congestive heart failure, cirrhosis, hypertension, Is any to the emergency department with complaints of shortness of breath. She recently saw her cardiologist Dr. Swaziland on 09/01/2014 at which time her Lasix dose was decreased from 40 mg by mouth twice a day to 40 mg by mouth daily. Her creatinine was 2.31 on 09/01/2014 that increased to 2.9 on 09/08/2014. She presents to the emergency department with complaints of Shortness of breath, orthopnea, 8 pound weight gain over the past week and increasing bilateral extremity pitting edema.  Assessment/Plan: Acute on chronic diastolic CHF -03/19/2013 echocardiogram EF 55-60%, severe aortic stenosis -appreciate cardiology consult -Continue intravenous furosemide -Dry weight appears to be approximately 164 pounds -Daily weights -Neg 1L so far Acute on chronic renal failure (CKD stage IV) -As the patient is not likely a dialysis candidate, continue diuresis regardless of the patient's serum creatinine at this time -Focus on relieving patient's dyspnea symptoms is primary goal at this point -Baseline creatinine 1.9-2.3 Severe aortic stenosis -Patient is not a candidate for surgical aortic valve replacement or TAVR secondary to her hepatic cirrhosis and multiple comorbidities Hypertension -Discontinue ACE inhibitor in the setting of worsening renal failure Goals of care -Palliative medicine consulted -Discussed with the patient's daughter--like to keep her full code until family has discussion with palliative medicine Hepatic cirrhosis -CT scan was performed on 03/04/2008 that showed findings suggestive of cirrhosis and portal hypertension. Study also revealed presence of ascites.  Thrombocytopenia -Secondary to cirrhosis -No signs  of bleeding presently -continue trend Macrocytic Anemia -check serum B12  -RBC folate Family Communication:   Daughter updated at beside Disposition Plan:   Home when medically stable       Procedures/Studies: Dg Chest 2 View  09/11/2014   CLINICAL DATA:  81 year old female with shortness of Breath acute onset this morning. Initial encounter.  EXAM: CHEST  2 VIEW  COMPARISON:  11/24/2013 and earlier.  FINDINGS: Semi upright AP and lateral views of the chest. Chronic moderate to severe cardiomegaly appears stable. Calcified atherosclerosis of the aorta. Other mediastinal contours are within normal limits. Increased pleural fluid, primarily in the fissures. Increased interstitial markings diffusely. No pneumothorax. No consolidation. Osteopenia. No acute osseous abnormality identified.  IMPRESSION: Pulmonary interstitial edema with small pleural effusions. Chronic cardiomegaly. Calcified aortic atherosclerosis.   Electronically Signed   By: Odessa Fleming M.D.   On: 09/11/2014 11:35        Subjective: Patient is breathing better. Denies any fevers, chills, chest discomfort, shortness breath, nausea, vomiting, diarrhea, abdominal pain, dysuria. No hematochezia or melena  Objective: Filed Vitals:   09/11/14 1456 09/11/14 2100 09/12/14 0538 09/12/14 0954  BP: 127/37 125/53 132/39 115/36  Pulse: 65 70 65 68  Temp: 97.7 F (36.5 C) 98.5 F (36.9 C) 97.6 F (36.4 C)   TempSrc: Oral Oral Oral   Resp: 18 18 16 18   Height: 5\' 3"  (1.6 m)     Weight: 77.973 kg (171 lb 14.4 oz)  76.839 kg (169 lb 6.4 oz)   SpO2: 96% 100% 94% 100%    Intake/Output Summary (Last 24 hours) at 09/12/14 1348 Last data filed at 09/12/14 1347  Gross per 24 hour  Intake   1032 ml  Output   2400 ml  Net  -1368 ml  Weight change:  Exam:   General:  Pt is alert, follows commands appropriately, not in acute distress  HEENT: No icterus, No thrush,  [place]/AT  Cardiovascular: RRR, S1/S2, no rubs, no  gallops  Respiratory: CTA bilaterally, no wheezing, no crackles, no rhonchi  Abdomen: Soft/+BS, non tender, non distended, no guarding; no hepatosplenomegaly  Extremities: 2+LE edema, No lymphangitis, No petechiae, No rashes, no synovitis; no cyanosis or clubbing  Data Reviewed: Basic Metabolic Panel:  Recent Labs Lab 09/08/14 1054 09/08/14 1055 09/11/14 1103 09/12/14 0355  NA  --  142 140 141  K  --  5.2* 4.7 4.6  CL  --  111 111 109  CO2  --  26 22 26   GLUCOSE  --  161* 140* 85  BUN  --  32* 29* 27*  CREATININE  --  2.90* 2.72* 2.54*  CALCIUM 8.0* 8.2* 8.3* 8.3*  PHOS  --  4.1  --   --    Liver Function Tests:  Recent Labs Lab 09/08/14 1055 09/11/14 1103  AST  --  26  ALT  --  17  ALKPHOS  --  79  BILITOT  --  1.5*  PROT  --  5.7*  ALBUMIN 2.2* 2.3*   No results for input(s): LIPASE, AMYLASE in the last 168 hours.  Recent Labs Lab 09/11/14 1103  AMMONIA 62*   CBC:  Recent Labs Lab 09/08/14 1044 09/11/14 1103 09/12/14 0355  WBC  --  5.4 5.7  NEUTROABS  --  3.6  --   HGB 7.8* 8.1* 8.0*  HCT  --  24.9* 24.5*  MCV  --  104.2* 104.3*  PLT  --  92* 102*   Cardiac Enzymes: No results for input(s): CKTOTAL, CKMB, CKMBINDEX, TROPONINI in the last 168 hours. BNP: Invalid input(s): POCBNP CBG: No results for input(s): GLUCAP in the last 168 hours.  No results found for this or any previous visit (from the past 240 hour(s)).   Scheduled Meds: . aspirin EC  81 mg Oral QHS  . furosemide  40 mg Intravenous BID  . heparin  5,000 Units Subcutaneous 3 times per day  . sodium chloride  3 mL Intravenous Q12H  . spironolactone  100 mg Oral q morning - 10a   Continuous Infusions:    Tanita Palinkas, DO  [facility] Pager [phone]  If 7PM-7AM, please contact night-coverage [email or web address] Password [email or web address] 09/12/2014, 1:48 PM   LOS: 1 day

## 2014-09-12 NOTE — Clinical Documentation Improvement (Signed)
Cardiology Internal Medicine  Based on the clinical findings below, please clarify if patient has:   Hypertensive Heart Disease  Cardiorenal Syndrome - concern for documented in H&P  Other  Clinically Undetermined  Supporting Information:  Acute on Chronic Diastolic CHF  Acute on Chronic Hypertensive Kidney Disease Stage 3  Cirrhosis with possible Portal Hypertension  Please exercise your independent, professional judgment when responding. A specific answer is not anticipated or expected.  Thank You, Shellee Milo BSN, RN Health Information Management  [phone]

## 2014-09-12 NOTE — Progress Notes (Signed)
Patient Name: Jennifer Neal Date of Encounter: 09/12/2014  Principal Problem:   Acute on chronic diastolic CHF (congestive heart failure) Active Problems:   Aortic stenosis, severe   Hepatic cirrhosis   CKD (chronic kidney disease), stage III   Essential hypertension   Anemia of chronic disease   Acute heart failure   Acute on chronic congestive heart failure   Length of Stay: 1  SUBJECTIVE  Improved, breathing less labored. Good UO. Improving renal parameters despite diuresis.  CURRENT MEDS . aspirin EC  81 mg Oral QHS  . furosemide  40 mg Intravenous Daily  . heparin  5,000 Units Subcutaneous 3 times per day  . sodium chloride  3 mL Intravenous Q12H  . spironolactone  100 mg Oral q morning - 10a    OBJECTIVE   Intake/Output Summary (Last 24 hours) at 09/12/14 1044 Last data filed at 09/12/14 0900  Gross per 24 hour  Intake    810 ml  Output   2100 ml  Net  -1290 ml   Filed Weights   09/11/14 1255 09/11/14 1456 09/12/14 0538  Weight: 172 lb 6.4 oz (78.2 kg) 171 lb 14.4 oz (77.973 kg) 169 lb 6.4 oz (76.839 kg)    PHYSICAL EXAM Filed Vitals:   09/11/14 1456 09/11/14 2100 09/12/14 0538 09/12/14 0954  BP: 127/37 125/53 132/39 115/36  Pulse: 65 70 65 68  Temp: 97.7 F (36.5 C) 98.5 F (36.9 C) 97.6 F (36.4 C)   TempSrc: Oral Oral Oral   Resp: 18 18 16 18   Height: 5\' 3"  (1.6 m)     Weight: 171 lb 14.4 oz (77.973 kg)  169 lb 6.4 oz (76.839 kg)   SpO2: 96% 100% 94% 100%   General: Alert, oriented x3, no distress Head: no evidence of trauma, PERRL, EOMI, no exophtalmos or lid lag, no myxedema, no xanthelasma; normal ears, nose and oropharynx Neck: 5 cm jugular venous pulsations and no hepatojugular reflux; carotid pulses with delay, low volume and bilateral bruits Chest: clear to auscultation, no signs of consolidation by percussion or palpation, normal fremitus, symmetrical and full respiratory excursions Cardiovascular: normal position and quality of the  apical impulse, regular rhythm, normal first and second heart sounds, no rubs or gallops, 3/6 mid-late peaking murmur Abdomen: no tenderness or distention, no masses by palpation, no abnormal pulsatility or arterial bruits, normal bowel sounds, no hepatosplenomegaly Extremities: no clubbing, cyanosis or edema; 2+ radial, ulnar and brachial pulses bilaterally; 2+ right femoral, posterior tibial and dorsalis pedis pulses; 2+ left femoral, posterior tibial and dorsalis pedis pulses; no subclavian or femoral bruits Neurological: grossly nonfocal  LABS  CBC  Recent Labs  09/11/14 1103 09/12/14 0355  WBC 5.4 5.7  NEUTROABS 3.6  --   HGB 8.1* 8.0*  HCT 24.9* 24.5*  MCV 104.2* 104.3*  PLT 92* 102*   Basic Metabolic Panel  Recent Labs  09/11/14 1103 09/12/14 0355  NA 140 141  K 4.7 4.6  CL 111 109  CO2 22 26  GLUCOSE 140* 85  BUN 29* 27*  CREATININE 2.72* 2.54*  CALCIUM 8.3* 8.3*   Liver Function Tests  Recent Labs  09/11/14 1103  AST 26  ALT 17  ALKPHOS 79  BILITOT 1.5*  PROT 5.7*  ALBUMIN 2.3*   Radiology Studies Imaging results have been reviewed and Dg Chest 2 View  09/11/2014   CLINICAL DATA:  81 year old female with shortness of Breath acute onset this morning. Initial encounter.  EXAM: CHEST  2 VIEW  COMPARISON:  11/24/2013 and earlier.  FINDINGS: Semi upright AP and lateral views of the chest. Chronic moderate to severe cardiomegaly appears stable. Calcified atherosclerosis of the aorta. Other mediastinal contours are within normal limits. Increased pleural fluid, primarily in the fissures. Increased interstitial markings diffusely. No pneumothorax. No consolidation. Osteopenia. No acute osseous abnormality identified.  IMPRESSION: Pulmonary interstitial edema with small pleural effusions. Chronic cardiomegaly. Calcified aortic atherosclerosis.   Electronically Signed   By: Odessa Fleming M.D.   On: 09/11/2014 11:35    TELE NSR   ASSESSMENT AND PLAN  End stage aortic  stenosis. Improving symptomatically. Still probably 3 lb, 1.5L to go before we reach previous steady state. Would plan for that much diuresis even if renal function deteriorates. Palliative care evaluation.   Thurmon Fair, MD, [facility] [phone] office [phone] pager 09/12/2014 10:44 AM

## 2014-09-13 DIAGNOSIS — Z515 Encounter for palliative care: Secondary | ICD-10-CM

## 2014-09-13 DIAGNOSIS — Z7189 Other specified counseling: Secondary | ICD-10-CM

## 2014-09-13 LAB — BASIC METABOLIC PANEL
Anion gap: 6 (ref 5–15)
BUN: 25 mg/dL — AB (ref 6–20)
CHLORIDE: 107 mmol/L (ref 101–111)
CO2: 27 mmol/L (ref 22–32)
CREATININE: 2.41 mg/dL — AB (ref 0.44–1.00)
Calcium: 8.4 mg/dL — ABNORMAL LOW (ref 8.9–10.3)
GFR calc Af Amer: 20 mL/min — ABNORMAL LOW (ref >60)
GFR calc non Af Amer: 17 mL/min — ABNORMAL LOW (ref >60)
GLUCOSE: 88 mg/dL (ref 65–99)
Potassium: 4.4 mmol/L (ref 3.5–5.1)
SODIUM: 140 mmol/L (ref 135–145)

## 2014-09-13 LAB — GLUCOSE, CAPILLARY: GLUCOSE-CAPILLARY: 102 mg/dL — AB (ref 65–99)

## 2014-09-13 LAB — VITAMIN B12: Vitamin B-12: 456 pg/mL (ref 180–914)

## 2014-09-13 MED ORDER — CAMPHOR-MENTHOL 0.5-0.5 % EX LOTN
TOPICAL_LOTION | CUTANEOUS | Status: DC | PRN
  Administered 2014-09-13: 22:00:00 via TOPICAL
  Filled 2014-09-13: qty 222

## 2014-09-13 MED ORDER — POLYVINYL ALCOHOL 1.4 % OP SOLN
2.0000 [drp] | OPHTHALMIC | Status: DC | PRN
Start: 2014-09-13 — End: 2014-09-14
  Administered 2014-09-13: 2 [drp] via OPHTHALMIC
  Filled 2014-09-13: qty 15

## 2014-09-13 NOTE — Progress Notes (Signed)
Utilization Review Completed.Jennifer Neal T9/04/2014  

## 2014-09-13 NOTE — Progress Notes (Addendum)
Thank you for consulting the Palliative Medicine Team at [facility] to meet your patient's and family's needs.   The reason that you asked [place] to see your patient is  For GOC  Await call back from family to  schedule  a meeting for Monday, 09-14-14  The Surrogate decision make is: family as a whole  Lorinda Creed NP  Palliative Medicine Team Team Phone # [phone] Pager [phone]

## 2014-09-13 NOTE — Progress Notes (Signed)
Patient Name: Jennifer Neal Date of Encounter: 09/13/2014  Principal Problem:   Acute on chronic diastolic CHF (congestive heart failure) Active Problems:   Aortic stenosis, severe   Hepatic cirrhosis   CKD (chronic kidney disease), stage III   Essential hypertension   Anemia of chronic disease   Acute heart failure   Acute on chronic congestive heart failure   Acute renal failure superimposed on stage 4 chronic kidney disease   Cirrhosis   Palliative care encounter   DNR (do not resuscitate) discussion   Primary Cardiologist: Dr [place]  Patient Profile: 81 y.o. female with a history of severe AS (not AVR/TAVR candidate), chronic diastolic CHF, HTN, cirrhosis, and anemia. Admitted 09/02 w/ CHF.  SUBJECTIVE: Breathing OK, no chest pain. Some eye irritation, L>R, itching  OBJECTIVE Filed Vitals:   09/12/14 0954 09/12/14 1430 09/12/14 2049 09/13/14 0356  BP: 115/36 131/37 124/39 135/38  Pulse: 68 68 70 72  Temp:  98.1 F (36.7 C) 98.8 F (37.1 C) 98.2 F (36.8 C)  TempSrc:  Oral Oral Oral  Resp: 18 18 18 16   Height:      Weight:    165 lb 11.2 oz (75.161 kg)  SpO2: 100% 96% 97% 96%    Intake/Output Summary (Last 24 hours) at 09/13/14 0947 Last data filed at 09/13/14 0359  Gross per 24 hour  Intake    882 ml  Output   1875 ml  Net   -993 ml   Filed Weights   09/11/14 1456 09/12/14 0538 09/13/14 0356  Weight: 171 lb 14.4 oz (77.973 kg) 169 lb 6.4 oz (76.839 kg) 165 lb 11.2 oz (75.161 kg)    PHYSICAL EXAM General: Well developed, well nourished, female in no acute distress. Head: Normocephalic, atraumatic.  Left eye with some whitish crust.   Neck: Supple without bruits, JVD 9 cm, + hepatojugular reflux. Lungs:  Resp regular and unlabored, decreased BS bases, very few rales Heart: RRR, S1, S2, no S3, S4, or murmur; no rub. Abdomen: Soft, non-tender, non-distended, BS + x 4.  Extremities: No clubbing, cyanosis, 2 + edema.  Neuro: Alert and oriented X  3. Moves all extremities spontaneously. Psych: Normal affect.  LABS: CBC: Recent Labs  09/11/14 1103 09/12/14 0355  WBC 5.4 5.7  NEUTROABS 3.6  --   HGB 8.1* 8.0*  HCT 24.9* 24.5*  MCV 104.2* 104.3*  PLT 92* 102*   Basic Metabolic Panel: Recent Labs  09/12/14 0355 09/13/14 0458  NA 141 140  K 4.6 4.4  CL 109 107  CO2 26 27  GLUCOSE 85 88  BUN 27* 25*  CREATININE 2.54* 2.41*  CALCIUM 8.3* 8.4*   Liver Function Tests: Recent Labs  09/11/14 1103  AST 26  ALT 17  ALKPHOS 79  BILITOT 1.5*  PROT 5.7*  ALBUMIN 2.3*    Recent Labs  09/11/14 1113  TROPIPOC 0.03   BNP:  B NATRIURETIC PEPTIDE  Date/Time Value Ref Range Status  09/11/2014 11:03 AM 735.1* 0.0 - 100.0 pg/mL Final   Anemia Panel: Recent Labs  09/13/14 0458  VITAMINB12 456    TELE:  SR, no sig ectopy   Radiology/Studies: Dg Chest 2 View 09/11/2014   CLINICAL DATA:  81 year old female with shortness of Breath acute onset this morning. Initial encounter.  EXAM: CHEST  2 VIEW  COMPARISON:  11/24/2013 and earlier.  FINDINGS: Semi upright AP and lateral views of the chest. Chronic moderate to severe cardiomegaly appears stable. Calcified atherosclerosis of the  aorta. Other mediastinal contours are within normal limits. Increased pleural fluid, primarily in the fissures. Increased interstitial markings diffusely. No pneumothorax. No consolidation. Osteopenia. No acute osseous abnormality identified.  IMPRESSION: Pulmonary interstitial edema with small pleural effusions. Chronic cardiomegaly. Calcified aortic atherosclerosis.   Electronically Signed   By: Odessa Fleming M.D.   On: 09/11/2014 11:35   Current Medications:  . aspirin EC  81 mg Oral QHS  . furosemide  40 mg Intravenous BID  . heparin  5,000 Units Subcutaneous 3 times per day  . sodium chloride  3 mL Intravenous Q12H  . spironolactone  100 mg Oral q morning - 10a    ASSESSMENT AND PLAN: Principal Problem:   Acute on chronic diastolic CHF  (congestive heart failure) - continue IV Lasix for 1-2 more doses - add TED hose for LE edema and see if family can get on/off for home. - continue to follow renal function, tolerating diuresis so far.  Active Problems:   Aortic stenosis, severe - end stage, not a candidate for AVR/TAVR  Otherwise, per IM. Agree with Palliative Care consult. Will order artificial tears for eyes, otherwise, per IM.   Hepatic cirrhosis   CKD (chronic kidney disease), stage III   Essential hypertension   Anemia of chronic disease   Acute heart failure   Acute on chronic congestive heart failure   Acute renal failure superimposed on stage 4 chronic kidney disease   Cirrhosis   Palliative care encounter   DNR (do not resuscitate) discussion   Signed, Theodore Demark , PA-C 9:47 AM 09/13/2014  I have seen and examined the patient along with Barrett, Bjorn Loser , PA-C.  I have reviewed the chart, notes and new data.  I agree with PA's note.  PLAN: Daughter [name] is the primary caregiver, but the patient is part of a large family. I agree palliative care is the only option at this time and [name] understands this, but a family conference will be necessary.  Thurmon Fair, MD, [facility] [phone] 09/13/2014, 10:49 AM

## 2014-09-13 NOTE — Progress Notes (Signed)
PROGRESS NOTE  Jennifer Neal:[id number] DOB: November 28, 1927 DOA: 09/11/2014 PCP: Delorse Lek, MD  Brief History 81 y.o. female with a past medical history of severe aortic stenosis, chronic diastolic congestive heart failure, cirrhosis, hypertension, Is any to the emergency department with complaints of shortness of breath. She recently saw her cardiologist Dr. [place] on 09/01/2014 at which time her Lasix dose was decreased from 40 mg by mouth twice a day to 40 mg by mouth daily. Her creatinine was 2.31 on 09/01/2014 that increased to 2.9 on 09/08/2014. She presents to the emergency department with complaints of Shortness of breath, orthopnea, 8 pound weight gain over the past week and increasing bilateral extremity pitting edema.  Assessment/Plan: Acute on chronic diastolic CHF -03/19/2013 echocardiogram EF 55-60%, severe aortic stenosis -appreciate cardiology followup -Continue intravenous furosemide--increase to bid -Dry weight appears to be approximately 164 pounds -Daily weights--Neg 4 pounds -Neg 2.2L so far Acute on chronic renal failure (CKD stage IV) -As the patient is not likely a dialysis candidate, continue diuresis regardless of the patient's serum creatinine at this time -Focus on relieving patient's dyspnea symptoms is primary goal at this point -Baseline creatinine 1.9-2.3 Severe aortic stenosis -Patient is not a candidate for surgical aortic valve replacement or TAVR secondary to her hepatic cirrhosis and multiple comorbidities Hypertension -Discontinue ACE inhibitor in the setting of worsening renal failure -BP remains controlled Goals of care -Palliative medicine consulted -Discussed with the patient's daughter--like to keep her full code until family has discussion with palliative medicine Hepatic cirrhosis -CT scan was performed on 03/04/2008 that showed findings suggestive of cirrhosis and portal hypertension. Study also revealed presence of  ascites.  Thrombocytopenia -Secondary to cirrhosis -No signs of bleeding presently -continue trend Macrocytic Anemia -check serum B12--456  -RBC folate--pending  Family Communication: Daughter updated at beside--family seems to have poor insight into patient's medical condition Disposition Plan: Home when medically stable      Procedures/Studies: Dg Chest 2 View  09/11/2014   CLINICAL DATA:  81 year old female with shortness of Breath acute onset this morning. Initial encounter.  EXAM: CHEST  2 VIEW  COMPARISON:  11/24/2013 and earlier.  FINDINGS: Semi upright AP and lateral views of the chest. Chronic moderate to severe cardiomegaly appears stable. Calcified atherosclerosis of the aorta. Other mediastinal contours are within normal limits. Increased pleural fluid, primarily in the fissures. Increased interstitial markings diffusely. No pneumothorax. No consolidation. Osteopenia. No acute osseous abnormality identified.  IMPRESSION: Pulmonary interstitial edema with small pleural effusions. Chronic cardiomegaly. Calcified aortic atherosclerosis.   Electronically Signed   By: Odessa Fleming M.D.   On: 09/11/2014 11:35         Subjective: Patient complains of legs itching. Denies any fevers, chills, chest pain, shortness breath, nausea, vomiting, diarrhea, abdominal pain, dysuria. She has occasional hand cramping. There is no headache, focal extremity weakness, dysarthria, visual disturbance.  Objective: Filed Vitals:   09/12/14 0954 09/12/14 1430 09/12/14 2049 09/13/14 0356  BP: 115/36 131/37 124/39 135/38  Pulse: 68 68 70 72  Temp:  98.1 F (36.7 C) 98.8 F (37.1 C) 98.2 F (36.8 C)  TempSrc:  Oral Oral Oral  Resp: 18 18 18 16   Height:      Weight:    75.161 kg (165 lb 11.2 oz)  SpO2: 100% 96% 97% 96%    Intake/Output Summary (Last 24 hours) at 09/13/14 1412 Last data filed at 09/13/14 1331  Gross per 24 hour  Intake  885 ml  Output   1875 ml  Net   -990 ml    Weight change: -3.039 kg (-6 lb 11.2 oz) Exam:   General:  Pt is alert, follows commands appropriately, not in acute distress  HEENT: No icterus, No thrush, No neck mass, [place]/AT  Cardiovascular: RRR, S1/S2, no rubs, no gallops  Respiratory: Fine bibasilar crackles. No wheezing. Good air movement.  Abdomen: Soft/+BS, non tender, non distended, no guarding; no hepatosplenomegaly  Extremities: 2+LE edema, No lymphangitis, No petechiae, No rashes, no synovitis; no cyanosis or clubbing  Data Reviewed: Basic Metabolic Panel:  Recent Labs Lab 09/08/14 1054 09/08/14 1055 09/11/14 1103 09/12/14 0355 09/13/14 0458  NA  --  142 140 141 140  K  --  5.2* 4.7 4.6 4.4  CL  --  111 111 109 107  CO2  --  26 22 26 27   GLUCOSE  --  161* 140* 85 88  BUN  --  32* 29* 27* 25*  CREATININE  --  2.90* 2.72* 2.54* 2.41*  CALCIUM 8.0* 8.2* 8.3* 8.3* 8.4*  PHOS  --  4.1  --   --   --    Liver Function Tests:  Recent Labs Lab 09/08/14 1055 09/11/14 1103  AST  --  26  ALT  --  17  ALKPHOS  --  79  BILITOT  --  1.5*  PROT  --  5.7*  ALBUMIN 2.2* 2.3*   No results for input(s): LIPASE, AMYLASE in the last 168 hours.  Recent Labs Lab 09/11/14 1103  AMMONIA 62*   CBC:  Recent Labs Lab 09/08/14 1044 09/11/14 1103 09/12/14 0355  WBC  --  5.4 5.7  NEUTROABS  --  3.6  --   HGB 7.8* 8.1* 8.0*  HCT  --  24.9* 24.5*  MCV  --  104.2* 104.3*  PLT  --  92* 102*   Cardiac Enzymes: No results for input(s): CKTOTAL, CKMB, CKMBINDEX, TROPONINI in the last 168 hours. BNP: Invalid input(s): POCBNP CBG:  Recent Labs Lab 09/12/14 2045 09/13/14 0715  GLUCAP 116* 102*    No results found for this or any previous visit (from the past 240 hour(s)).   Scheduled Meds: . aspirin EC  81 mg Oral QHS  . furosemide  40 mg Intravenous BID  . heparin  5,000 Units Subcutaneous 3 times per day  . sodium chloride  3 mL Intravenous Q12H  . spironolactone  100 mg Oral q morning - 10a    Continuous Infusions:    [name], DO  [facility] Pager [phone]  If 7PM-7AM, please contact night-coverage [email or web address] Password [email or web address] 09/13/2014, 2:12 PM   LOS: 2 days

## 2014-09-14 DIAGNOSIS — Z515 Encounter for palliative care: Secondary | ICD-10-CM

## 2014-09-14 DIAGNOSIS — D638 Anemia in other chronic diseases classified elsewhere: Secondary | ICD-10-CM

## 2014-09-14 DIAGNOSIS — Z7189 Other specified counseling: Secondary | ICD-10-CM

## 2014-09-14 LAB — BASIC METABOLIC PANEL
Anion gap: 9 (ref 5–15)
BUN: 25 mg/dL — ABNORMAL HIGH (ref 6–20)
CALCIUM: 8.4 mg/dL — AB (ref 8.9–10.3)
CO2: 28 mmol/L (ref 22–32)
CREATININE: 2.47 mg/dL — AB (ref 0.44–1.00)
Chloride: 102 mmol/L (ref 101–111)
GFR, EST AFRICAN AMERICAN: 20 mL/min — AB (ref >60)
GFR, EST NON AFRICAN AMERICAN: 17 mL/min — AB (ref >60)
Glucose, Bld: 102 mg/dL — ABNORMAL HIGH (ref 65–99)
Potassium: 4.7 mmol/L (ref 3.5–5.1)
SODIUM: 139 mmol/L (ref 135–145)

## 2014-09-14 LAB — MAGNESIUM: MAGNESIUM: 1.7 mg/dL (ref 1.7–2.4)

## 2014-09-14 MED ORDER — FUROSEMIDE 40 MG PO TABS
40.0000 mg | ORAL_TABLET | Freq: Two times a day (BID) | ORAL | Status: DC
  Administered 2014-09-14: 40 mg via ORAL
  Filled 2014-09-14: qty 1

## 2014-09-14 MED ORDER — FUROSEMIDE 40 MG PO TABS: 40.0000 mg | ORAL_TABLET | Freq: Two times a day (BID) | ORAL | Status: AC

## 2014-09-14 NOTE — Consult Note (Signed)
Consultation Note Date: 09/14/2014   Patient Name: Jennifer Neal  DOB: 30-May-1927  MRN: [id number]  Age / Sex: 81 y.o., female   PCP: Delorse Lek, MD Referring Physician: Catarina Hartshorn, MD  Reason for Consultation: Establishing goals of care, Non pain symptom management, Pain control and Psychosocial/spiritual support  Palliative Care Assessment and Plan Summary of Established Goals of Care and Medical Treatment Preferences    Palliative Care Discussion Held Today:    This NP Lorinda Creed reviewed medical records, received report from team, assessed the patient and then meet at the patient's bedside along with her family to include four of her daughters   to discuss diagnosis, prognosis, GOC, EOL wishes disposition and options.   A detailed discussion was had today regarding advanced directives.  Concepts specific to code status, artifical feeding and hydration, continued IV antibiotics and rehospitalization was had.  The difference between a aggressive medical intervention path  and a palliative comfort care path for this patient at this time was had.  Values and goals of care important to patient and family were attempted to be elicited.  Concept of Hospice and Palliative Care were discussed  Natural trajectory and expectations related to severe aortic stenosis and overall failure to thrive  were discussed.  Questions and concerns addressed.  Hard Choices booklet left for review. Family encouraged to call with questions or concerns.  PMT will continue to support holisticall   Primary Decision Maker: family as a whole/ no documented HPOA  Goals of Care/Code Status/Advance Care Planning:   Code Status:  Full code Strongly encouraged to consider a DNR status knowing limitaions of resuscitation intervention in similar patients.   Dyspnea:  Medical manage of chronic disease  Psycho-social/Spiritual:   Support System: large family  Desire for further Chaplaincy  support:no  Prognosis: < 6 months  Discharge Planning:  Home with Hospice, I contacted [facility] and left referral        Chief Complaint:    Shortness of breath  History of Present Illness:   81 y.o. female with a past medical history of severe aortic stenosis (not a candidate for AVR or TAVR), chronic diastolic congestive heart failure, cirrhosis, hypertension, Stage III CKD, seen in the emergency department with complaints of shortness of breath.   She recently saw her cardiologist Dr. Swaziland on 09/01/2014 at which time her Lasix dose was decreased from 40 mg by mouth twice a day to 40 mg by mouth daily. Her creatinine was 2.31 on 09/01/2014 that increased to 2.9 on 09/08/2014. She presents to the emergency department with complaints of Shortness of breath, orthopnea, 8 pound weight gain over the past week and increasing bilateral extremity pitting edema. She was given 60 mg of IV Lasix in the emergency department.  She currently lives        in the community with family members. Lab work in the emergency department showed a BNP of 735 with creatinine of 2.72 and BUN of 29. A chest x-ray revealed pulmonary interstitial edema.  Faced with advanced directive decisions, and anticipatory care needs understanding limited treatment options.     Primary Diagnoses  Present on Admission:  . Acute on chronic diastolic CHF (congestive heart failure) . Essential hypertension . Hepatic cirrhosis . Anemia of chronic disease . CKD (chronic kidney disease), stage III  Palliative Review of Systems:    -weakness, fatigue, BLE edema, dyspnea   I have reviewed the medical record, interviewed the patient and family, and examined the patient. The  following aspects are pertinent.  Past Medical History  Diagnosis Date  . Essential hypertension   . Severe aortic stenosis     a. 03/2013 Echo: EF 55-60%, mod LVH, Severe Ao Stenosis, mild MR, mildly  dil LA, PASP .  Marland Kitchen Rheumatoid arthritis(714.0)   . Asthma   . Hepatic cirrhosis   . Anemia of chronic disease     a. Regular Procrit injections.  . CKD (chronic kidney disease), stage III   . History of pneumonia Dec 2012  . Diastolic CHF, chronic     a. 03/2013 Echo: EF 55-60%, mod LVH, Severe Ao Stenosis, mild MR, mildly dil LA, PASP .  Marland Kitchen Resting tremor   . Lower extremity edema   . Complication of anesthesia   . PONV (postoperative nausea and vomiting)   . Heart murmur   . Borderline diabetes    Social History   Social History  . Marital Status: Widowed    Spouse Name: N/A  . Number of Children: 7  . Years of Education: N/A   Occupational History  . retired    Social History Main Topics  . Smoking status: Never Smoker   . Smokeless tobacco: Never Used  . Alcohol Use: No  . Drug Use: No  . Sexual Activity: Not Asked   Other Topics Concern  . None   Social History Narrative   Family History  Problem Relation Age of Onset  . Heart attack Brother   . Cancer Brother   . Cancer Brother   . Aortic aneurysm Brother   . Cancer Sister   . Cancer Sister   . Cancer Sister   . Goiter Mother    Scheduled Meds: . aspirin EC  81 mg Oral QHS  . furosemide  40 mg Oral BID  . heparin  5,000 Units Subcutaneous 3 times per day  . sodium chloride  3 mL Intravenous Q12H  . spironolactone  100 mg Oral q morning - 10a   Continuous Infusions:  PRN Meds:.sodium chloride, acetaminophen, camphor-menthol, ondansetron (ZOFRAN) IV, polyvinyl alcohol, sodium chloride Medications Prior to Admission:  Prior to Admission medications   Medication Sig Start Date End Date Taking? Authorizing Provider  amLODipine (NORVASC) 5 MG tablet Take 2.5 mg by mouth daily.   Yes Historical Provider, MD  aspirin 81 MG EC tablet Take 81 mg by mouth at bedtime.    Yes Historical Provider, MD  ferrous sulfate 325 (65 FE) MG tablet Take 325 mg by mouth 2 (two) times daily with a meal.   Yes  Historical Provider, MD  furosemide (LASIX) 40 MG tablet Take 40 mg by mouth daily. 08/27/14  Yes Peter M Swaziland, MD  lisinopril (PRINIVIL,ZESTRIL) 20 MG tablet TAKE 1 TABLET BY MOUTH TWICE A DAY 05/06/14  Yes Peter M Swaziland, MD  spironolactone (ALDACTONE) 25 MG tablet Take 100 mg by mouth every morning.    Yes Historical Provider, MD  trolamine salicylate (ASPERCREME) 10 % cream Apply 1 application topically as needed for muscle pain.   Yes Historical Provider, MD  epoetin alfa (EPOGEN,PROCRIT) 09381 UNIT/ML injection Inject 40,000 Units into the skin every 14 (fourteen) days.    Historical Provider, MD   Allergies  Allergen Reactions  . Penicillins Swelling  . Sulfa Drugs Cross Reactors Rash  . Codeine Nausea And Vomiting   CBC:    Component Value Date/Time   WBC 5.7 09/12/2014 0355   HGB 8.0* 09/12/2014 0355   HCT 24.5* 09/12/2014 0355   PLT 102* 09/12/2014  0355   MCV 104.3* 09/12/2014 0355   NEUTROABS 3.6 09/11/2014 1103   LYMPHSABS 0.6* 09/11/2014 1103   MONOABS 1.1* 09/11/2014 1103   EOSABS 0.0 09/11/2014 1103   BASOSABS 0.1 09/11/2014 1103   Comprehensive Metabolic Panel:    Component Value Date/Time   NA 139 09/14/2014 0309   K 4.7 09/14/2014 0309   CL 102 09/14/2014 0309   CO2 28 09/14/2014 0309   BUN 25* 09/14/2014 0309   CREATININE 2.47* 09/14/2014 0309   CREATININE 2.31* 09/01/2014 0913   GLUCOSE 102* 09/14/2014 0309   CALCIUM 8.4* 09/14/2014 0309   CALCIUM 8.0* 09/08/2014 1054   AST 26 09/11/2014 1103   ALT 17 09/11/2014 1103   ALKPHOS 79 09/11/2014 1103   BILITOT 1.5* 09/11/2014 1103   PROT 5.7* 09/11/2014 1103   ALBUMIN 2.3* 09/11/2014 1103    Physical Exam:  Vital Signs: BP 107/33 mmHg  Pulse 75  Temp(Src) 98.5 F (36.9 C) (Oral)  Resp 18  Ht 5\' 3"  (1.6 m)  Wt 74.118 kg (163 lb 6.4 oz)  BMI 28.95 kg/m2  SpO2 93% SpO2: SpO2: 93 % O2 Device: O2 Device: Nasal Cannula O2 Flow Rate: O2 Flow Rate (L/min): 2 L/min Intake/output summary:   Intake/Output Summary (Last 24 hours) at 09/14/14 0928 Last data filed at 09/14/14 0845  Gross per 24 hour  Intake   2225 ml  Output   2350 ml  Net   -125 ml   LBM: Last BM Date: 09/14/14 Baseline Weight: Weight: 78.2 kg (172 lb 6.4 oz) Most recent weight: Weight: 74.118 kg (163 lb 6.4 oz)  Exam Findings:   General: Chronically ill appearing, NAD HEENT: moist buccal membraens CVS: RRR Resp: decreased in bases Abd: soft NT^ +BS           Palliative Performance Scale:    30  %                Additional Data Reviewed: Recent Labs     09/11/14  1103  09/12/14  0355  09/13/14  0458  09/14/14  0309  WBC  5.4  5.7   --    --   HGB  8.1*  8.0*   --    --   PLT  92*  102*   --    --   NA  140  141  140  139  BUN  29*  27*  25*  25*  CREATININE  2.72*  2.54*  2.41*  2.47*     Time In: 1000 Time Out: 1120 Time Total: 80 min   Greater than 50%  of this time was spent counseling and coordinating care related to the above assessment and plan.  Discussed with  Dr  Tat  Signed by: Lorinda Creed, NP  Canary Brim, NP  09/14/2014, 9:28 AM  Please contact Palliative Medicine Team phone at [phone] for questions and concerns.   See AMION for contact information

## 2014-09-14 NOTE — Progress Notes (Signed)
Patient Name: Jennifer Neal Date of Encounter: 09/14/2014  Principal Problem:   Acute on chronic diastolic CHF (congestive heart failure) Active Problems:   Aortic stenosis, severe   Hepatic cirrhosis   CKD (chronic kidney disease), stage III   Essential hypertension   Anemia of chronic disease   Acute heart failure   Acute on chronic congestive heart failure   Acute renal failure superimposed on stage 4 chronic kidney disease   Cirrhosis   Palliative care encounter   DNR (do not resuscitate) discussion   Primary Cardiologist: Dr Swaziland  Patient Profile: 81 y.o. female with a history of severe AS (not AVR/TAVR candidate), chronic diastolic CHF, HTN, CKD cirrhosis, and anemia. Admitted 09/02 w/ CHF.  SUBJECTIVE: Breathing OK, no chest pain. Feels that swelling is improved.   OBJECTIVE Filed Vitals:   09/13/14 1400 09/13/14 2023 09/14/14 0500 09/14/14 0537  BP: 119/36 124/39  124/35  Pulse: 62 68  75  Temp: 98.1 F (36.7 C) 98.3 F (36.8 C)  98.5 F (36.9 C)  TempSrc: Oral Oral  Oral  Resp: 18 18  18   Height:      Weight:   74.118 kg (163 lb 6.4 oz)   SpO2: 98% 97%  93%    Intake/Output Summary (Last 24 hours) at 09/14/14 0752 Last data filed at 09/14/14 0630  Gross per 24 hour  Intake   2105 ml  Output   2350 ml  Net   -245 ml   Filed Weights   09/12/14 0538 09/13/14 0356 09/14/14 0500  Weight: 76.839 kg (169 lb 6.4 oz) 75.161 kg (165 lb 11.2 oz) 74.118 kg (163 lb 6.4 oz)    PHYSICAL EXAM General: Well developed, well nourished, female in no acute distress. Head: Normocephalic, atraumatic.  Neck: Supple without bruits, JVD 9 cm, + hepatojugular reflux. Lungs:  Resp regular and unlabored, decreased BS bases, very few rales Heart: RRR, S1, S2, no S3, S4, or murmur; no rub. Abdomen: Soft, non-tender, non-distended, BS + x 4.  Extremities: No clubbing, cyanosis, 1 + edema.  Neuro: Alert and oriented X 3. Moves all extremities spontaneously. Psych:  Normal affect.  LABS: CBC:  Recent Labs  09/11/14 1103 09/12/14 0355  WBC 5.4 5.7  NEUTROABS 3.6  --   HGB 8.1* 8.0*  HCT 24.9* 24.5*  MCV 104.2* 104.3*  PLT 92* 102*   Basic Metabolic Panel:  Recent Labs  11/12/14 0458 09/14/14 0309  NA 140 139  K 4.4 4.7  CL 107 102  CO2 27 28  GLUCOSE 88 102*  BUN 25* 25*  CREATININE 2.41* 2.47*  CALCIUM 8.4* 8.4*  MG  --  1.7   Liver Function Tests:  Recent Labs  09/11/14 1103  AST 26  ALT 17  ALKPHOS 79  BILITOT 1.5*  PROT 5.7*  ALBUMIN 2.3*    Recent Labs  09/11/14 1113  TROPIPOC 0.03   BNP:  B NATRIURETIC PEPTIDE  Date/Time Value Ref Range Status  09/11/2014 11:03 AM 735.1* 0.0 - 100.0 pg/mL Final   Anemia Panel:  Recent Labs  09/13/14 0458  VITAMINB12 456    TELE:  SR, no sig ectopy   Radiology/Studies: Dg Chest 2 View 09/11/2014   CLINICAL DATA:  81 year old female with shortness of Breath acute onset this morning. Initial encounter.  EXAM: CHEST  2 VIEW  COMPARISON:  11/24/2013 and earlier.  FINDINGS: Semi upright AP and lateral views of the chest. Chronic moderate to severe cardiomegaly appears stable. Calcified  atherosclerosis of the aorta. Other mediastinal contours are within normal limits. Increased pleural fluid, primarily in the fissures. Increased interstitial markings diffusely. No pneumothorax. No consolidation. Osteopenia. No acute osseous abnormality identified.  IMPRESSION: Pulmonary interstitial edema with small pleural effusions. Chronic cardiomegaly. Calcified aortic atherosclerosis.   Electronically Signed   By: Odessa Fleming M.D.   On: 09/11/2014 11:35   Current Medications:  . aspirin EC  81 mg Oral QHS  . furosemide  40 mg Intravenous BID  . heparin  5,000 Units Subcutaneous 3 times per day  . sodium chloride  3 mL Intravenous Q12H  . spironolactone  100 mg Oral q morning - 10a    ASSESSMENT AND PLAN: Principal Problem:   Acute on chronic diastolic CHF (congestive heart failure) -  Weight is back to baseline weight. Down 9 lbs since admission. Edema is improved. Will switch lasix to po 40 mg bid. - add TED hose for LE edema and see if family can get on/off for home. - continue to follow renal function, tolerating diuresis so far.  Active Problems:   Aortic stenosis, severe - end stage, not a candidate for AVR/TAVR  Otherwise, per IM. Agree with Palliative Care consult. Planning family conference for 10 am today. Patient has multiorgan end stage disease and nothing is fixable. Prognosis is very poor.    Hepatic cirrhosis   CKD (chronic kidney disease), stage III   Essential hypertension   Anemia of chronic disease   Acute heart failure   Acute on chronic congestive heart failure   Acute renal failure superimposed on stage 4 chronic kidney disease   Cirrhosis   Palliative care encounter   DNR (do not resuscitate) discussion   Signed, Peter Swaziland , MD,FACC [facility] [phone] 09/14/2014, 7:52 AM

## 2014-09-14 NOTE — Progress Notes (Signed)
Physical Therapy Treatment Patient Details Name: Jennifer Neal MRN: [id number] DOB: Sep 04, 1927 Today's Date: 09/14/2014    History of Present Illness Jennifer Neal is a 81 y.o. female with a past medical history of severe aortic stenosis, chronic diastolic congestive heart failure, cirrhosis, hypertension, Is any to the emergency department with complaints of shortness of breath.     PT Comments    Pt admitted with above diagnosis. Pt currently with functional limitations due to endurance deficits. Pt able to ambulate with RW with good safety overall.  Family to assist at home and pt to d/c today.   Pt will benefit from skilled PT to increase their independence and safety with mobility to allow discharge to the venue listed below.    Follow Up Recommendations  Home health PT;Supervision/Assistance - 24 hour     Equipment Recommendations  None recommended by PT    Recommendations for Other Services       Precautions / Restrictions Precautions Precautions: Fall Precaution Comments: SOB with mobility Restrictions Weight Bearing Restrictions: No    Mobility  Bed Mobility               General bed mobility comments: in chair on arrival.   Transfers Overall transfer level: Needs assistance Equipment used: Rolling walker (2 wheeled) Transfers: Sit to/from Stand Sit to Stand: Min guard         General transfer comment: cues for hand placement.  Steadying assist to standing postition.  Ambulation/Gait Ambulation/Gait assistance: Min guard Ambulation Distance (Feet): 100 Feet Assistive device: Rolling walker (2 wheeled) Gait Pattern/deviations: Step-through pattern;Decreased stride length Gait velocity: slow Gait velocity interpretation: Below normal speed for age/gender General Gait Details: Sats >93% on RA.  Pt safe with RW and did not need assist or cues.     Stairs            Wheelchair Mobility    Modified Rankin (Stroke Patients Only)        Balance Overall balance assessment: Needs assistance         Standing balance support: Bilateral upper extremity supported;During functional activity Standing balance-Leahy Scale: Poor Standing balance comment: Relies on UE support oof RW.                      Cognition Arousal/Alertness: Awake/alert Behavior During Therapy: WFL for tasks assessed/performed Overall Cognitive Status: Within Functional Limits for tasks assessed                      Exercises General Exercises - Upper Extremity Shoulder Flexion: AROM;Both;10 reps;Seated Shoulder Horizontal ABduction: AROM;Both;10 reps;Seated General Exercises - Lower Extremity Ankle Circles/Pumps: AROM;Both;10 reps;Seated Quad Sets: AROM;Both;10 reps;Seated Long Arc Quad: AROM;Both;10 reps;Seated Hip Flexion/Marching: AROM;Both;10 reps;Seated    General Comments        Pertinent Vitals/Pain Pain Assessment: No/denies pain  VSS with sats on RA >90% throughout.      Home Living                      Prior Function            PT Goals (current goals can now be found in the care plan section) Acute Rehab PT Goals Patient Stated Goal: home Progress towards PT goals: Progressing toward goals    Frequency  Min 3X/week    PT Plan Current plan remains appropriate    Co-evaluation  End of Session Equipment Utilized During Treatment: Gait belt Activity Tolerance: Patient limited by fatigue Patient left: in chair;with call bell/phone within reach;with family/visitor present     Time: 3532-9924 PT Time Calculation (min) (ACUTE ONLY): 15 min  Charges:  $Gait Training: 8-22 mins                    G CodesBerline Lopes 05-Oct-2014, 3:42 PM [employer] Acute Rehabilitation [phone] (pager)

## 2014-09-14 NOTE — Discharge Summary (Signed)
Physician Discharge Summary  LAIZA VEENSTRA OIZ:[id number] DOB: [birth year] DOA: 09/11/2014  PCP: Delorse Lek, MD  Admit date: 09/11/2014 Discharge date: 09/14/2014  Recommendations for Outpatient Follow-up:  1. Pt will need to follow up with PCP in 2 weeks post discharge 2. Please obtain BMP in one week  Discharge Diagnoses:  Acute on chronic diastolic CHF -03/19/2013 echocardiogram EF 55-60%, severe aortic stenosis -appreciate cardiology followup-->not a candidate for srugical intervention -Continue intravenous furosemide--increase to bid--improved clinically -Dry weight appears to be approximately 164 pounds -Daily weights--Neg 6 pounds -Neg 2.5L  -discharge weight 163 -Home with furosemide 40 mg bid--case discussed with Dr. Peter Swaziland Acute on chronic renal failure (CKD stage IV) -As the patient is not likely a dialysis candidate, continue diuresis regardless of the patient's serum creatinine at this time -Focus on relieving patient's dyspnea symptoms is primary goal at this point -Baseline creatinine 1.9-2.3 -Serum creatinine 2.47 discharge Severe aortic stenosis -Patient is not a candidate for surgical aortic valve replacement or TAVR secondary to her hepatic cirrhosis and multiple comorbidities Hypertension -Discontinue ACE inhibitor in the setting of worsening renal failure -BP remains controlled Goals of care -Palliative medicine consulted-->family still wishes to keep pt FULL CODE -palliative to help arrange hospice services at home Hepatic cirrhosis -CT scan was performed on 03/04/2008 that showed findings suggestive of cirrhosis and portal hypertension. Study also revealed presence of ascites.  Thrombocytopenia -Secondary to cirrhosis -No signs of bleeding presently -continue trend Macrocytic Anemia -check serum B12--456  -RBC folate--pending  Discharge Condition: stable  Disposition: home  Diet:heart healthy Wt Readings from Last 3 Encounters:    09/14/14 74.118 kg (163 lb 6.4 oz)  09/01/14 74.39 kg (164 lb)  07/09/14 80.015 kg (176 lb 6.4 oz)    History of present illness:  81 y.o. female with a past medical history of severe aortic stenosis, chronic diastolic congestive heart failure, cirrhosis, hypertension, Is any to the emergency department with complaints of shortness of breath. She recently saw her cardiologist Dr. Swaziland on 09/01/2014 at which time her Lasix dose was decreased from 40 mg by mouth twice a day to 40 mg by mouth daily. Her creatinine was 2.31 on 09/01/2014 that increased to 2.9 on 09/08/2014. She presents to the emergency department with complaints of Shortness of breath, orthopnea, 8 pound weight gain over the past week and increasing bilateral extremity pitting edema. The patient was seen by cardiology. The patient was started on intravenous furosemide with good clinical effect. She was negative 6 pounds for the admission. Patient is home with furosemide 40 mg twice a day.   Consultants: Palliative medicine cardiology  Discharge Exam: Filed Vitals:   09/14/14 0900  BP: 107/33  Pulse:   Temp:   Resp:    Filed Vitals:   09/13/14 2023 09/14/14 0500 09/14/14 0537 09/14/14 0900  BP: 124/39  124/35 107/33  Pulse: 68  75   Temp: 98.3 F (36.8 C)  98.5 F (36.9 C)   TempSrc: Oral  Oral   Resp: 18  18   Height:      Weight:  74.118 kg (163 lb 6.4 oz)    SpO2: 97%  93%    General: Awake and alert, NAD, pleasant, cooperative Cardiovascular: RRR, no rub, no gallop, no S3 Respiratory: Bibasilar crackles. No wheezing  Abdomen:soft, nontender, nondistended, positive bowel sounds Extremities: 1+LE  edema, No lymphangitis, no petechiae  Discharge Instructions      Discharge Instructions    Diet - low sodium heart healthy  Complete by:  As directed      Increase activity slowly    Complete by:  As directed             Medication List    STOP taking these medications        amLODipine 5 MG  tablet  Commonly known as:  NORVASC     lisinopril 20 MG tablet  Commonly known as:  PRINIVIL,ZESTRIL      TAKE these medications        aspirin 81 MG EC tablet  Take 81 mg by mouth at bedtime.     epoetin alfa 15056 UNIT/ML injection  Commonly known as:  EPOGEN,PROCRIT  Inject 40,000 Units into the skin every 14 (fourteen) days.     ferrous sulfate 325 (65 FE) MG tablet  Take 325 mg by mouth 2 (two) times daily with a meal.     furosemide 40 MG tablet  Commonly known as:  LASIX  Take 1 tablet (40 mg total) by mouth 2 (two) times daily.     spironolactone 25 MG tablet  Commonly known as:  ALDACTONE  Take 100 mg by mouth every morning.     trolamine salicylate 10 % cream  Commonly known as:  ASPERCREME  Apply 1 application topically as needed for muscle pain.         The results of significant diagnostics from this hospitalization (including imaging, microbiology, ancillary and laboratory) are listed below for reference.    Significant Diagnostic Studies: Dg Chest 2 View  09/11/2014   CLINICAL DATA:  81 year old female with shortness of Breath acute onset this morning. Initial encounter.  EXAM: CHEST  2 VIEW  COMPARISON:  11/24/2013 and earlier.  FINDINGS: Semi upright AP and lateral views of the chest. Chronic moderate to severe cardiomegaly appears stable. Calcified atherosclerosis of the aorta. Other mediastinal contours are within normal limits. Increased pleural fluid, primarily in the fissures. Increased interstitial markings diffusely. No pneumothorax. No consolidation. Osteopenia. No acute osseous abnormality identified.  IMPRESSION: Pulmonary interstitial edema with small pleural effusions. Chronic cardiomegaly. Calcified aortic atherosclerosis.   Electronically Signed   By: Odessa Fleming M.D.   On: 09/11/2014 11:35     Microbiology: No results found for this or any previous visit (from the past 240 hour(s)).   Labs: Basic Metabolic Panel:  Recent Labs Lab  09/08/14 1055 09/11/14 1103 09/12/14 0355 09/13/14 0458 09/14/14 0309  NA 142 140 141 140 139  K 5.2* 4.7 4.6 4.4 4.7  CL 111 111 109 107 102  CO2 26 22 26 27 28   GLUCOSE 161* 140* 85 88 102*  BUN 32* 29* 27* 25* 25*  CREATININE 2.90* 2.72* 2.54* 2.41* 2.47*  CALCIUM 8.2* 8.3* 8.3* 8.4* 8.4*  MG  --   --   --   --  1.7  PHOS 4.1  --   --   --   --    Liver Function Tests:  Recent Labs Lab 09/08/14 1055 09/11/14 1103  AST  --  26  ALT  --  17  ALKPHOS  --  79  BILITOT  --  1.5*  PROT  --  5.7*  ALBUMIN 2.2* 2.3*   No results for input(s): LIPASE, AMYLASE in the last 168 hours.  Recent Labs Lab 09/11/14 1103  AMMONIA 62*   CBC:  Recent Labs Lab 09/08/14 1044 09/11/14 1103 09/12/14 0355  WBC  --  5.4 5.7  NEUTROABS  --  3.6  --  HGB 7.8* 8.1* 8.0*  HCT  --  24.9* 24.5*  MCV  --  104.2* 104.3*  PLT  --  92* 102*   Cardiac Enzymes: No results for input(s): CKTOTAL, CKMB, CKMBINDEX, TROPONINI in the last 168 hours. BNP: Invalid input(s): POCBNP CBG:  Recent Labs Lab 09/12/14 2045 09/13/14 0715  GLUCAP 116* 102*    Time coordinating discharge:  Greater than 30 minutes  Signed:  Mahalia Dykes, DO [facility] Pager: [phone] 09/14/2014, 10:44 AM

## 2014-09-14 NOTE — Care Management Important Message (Signed)
Important Message  Patient Details  Name: VERSA CRATON MRN: [id number] Date of Birth: 1927/11/27   Medicare Important Message Given:  Yes-second notification given    Bernadette Hoit 09/14/2014, 8:53 AM

## 2014-09-15 ENCOUNTER — Telehealth: Payer: Self-pay | Admitting: Cardiology

## 2014-09-15 LAB — FOLATE RBC
FOLATE, HEMOLYSATE: 511.8 ng/mL
FOLATE, RBC: 2255 ng/mL (ref >498)
HEMATOCRIT: 22.7 % — AB (ref 34.0–46.6)

## 2014-09-15 NOTE — Telephone Encounter (Signed)
Given her multi-organ failure I think it would be appropriate for her primary care to be attending of record. I am happy to help out where needed.  Cobain Morici [place] MD, [facility]

## 2014-09-15 NOTE — Telephone Encounter (Signed)
Message sent to Dr.Jordan for advice. 

## 2014-09-15 NOTE — Telephone Encounter (Signed)
Returned call to [place] with [facility].Dr.Jordan advised to have PCP be attending.He is happy to help out where needed.

## 2014-09-15 NOTE — Telephone Encounter (Signed)
Jennifer Neal is calling from [facility] to see if Dr. [place] would approve of being the pt's Attendee for hospice and if he would want any of the doctors from [facility] to do symptoms management . She would like a response today if possible because they will be making a visit today to see the patient. Please call  Thanks

## 2014-09-22 ENCOUNTER — Encounter (HOSPITAL_COMMUNITY): Payer: Medicare Other

## 2014-09-30 ENCOUNTER — Encounter (HOSPITAL_COMMUNITY)
Admission: RE | Admit: 2014-09-30 | Discharge: 2014-09-30 | Disposition: A | Discharge status: Home / Self Care | Source: Ambulatory Visit | Attending: Nephrology | Admitting: Nephrology

## 2014-09-30 DIAGNOSIS — Z79899 Other long term (current) drug therapy: Secondary | ICD-10-CM | POA: Insufficient documentation

## 2014-09-30 DIAGNOSIS — N183 Chronic kidney disease, stage 3 (moderate): Secondary | ICD-10-CM | POA: Insufficient documentation

## 2014-09-30 DIAGNOSIS — D631 Anemia in chronic kidney disease: Secondary | ICD-10-CM | POA: Insufficient documentation

## 2014-09-30 DIAGNOSIS — Z5181 Encounter for therapeutic drug level monitoring: Secondary | ICD-10-CM | POA: Diagnosis not present

## 2014-09-30 LAB — POCT HEMOGLOBIN-HEMACUE: HEMOGLOBIN: 8.5 g/dL — AB (ref 12.0–15.0)

## 2014-09-30 MED ORDER — EPOETIN ALFA 40000 UNIT/ML IJ SOLN
INTRAMUSCULAR | Status: AC
  Administered 2014-09-30: 40000 [IU]
  Filled 2014-09-30: qty 1

## 2014-09-30 MED ORDER — EPOETIN ALFA 10000 UNIT/ML IJ SOLN
40000.0000 [IU] | INTRAMUSCULAR | Status: DC
Start: 2014-09-30 — End: 2014-10-01

## 2014-10-07 ENCOUNTER — Telehealth: Payer: Self-pay | Admitting: Cardiology

## 2014-10-07 NOTE — Telephone Encounter (Signed)
Returned call to patient.  Advised 40mg  lasix daily as outlined by Dr. at last OV - so yes, OK to cut dose.  Instructed to call if weight rebounds, may need to adjust lasix up again or do PRN dose - would defer to Dr. [place] at that time if an issue.  Instructed to call if further concerns.

## 2014-10-07 NOTE — Telephone Encounter (Signed)
Pt's daughter called in stating that the pt is currently taking 2 40mg  tablets of Furosemide and her weight is down to 135lbs. The daughter feels that her weight is abnormally low and would like to know if her dosage could be lowered since she is not having any problems with fluid retention. Please f/u  Thanks

## 2014-10-14 ENCOUNTER — Inpatient Hospital Stay (HOSPITAL_COMMUNITY): Admission: RE | Admit: 2014-10-14 | Payer: Medicare Other | Source: Ambulatory Visit

## 2014-10-22 ENCOUNTER — Other Ambulatory Visit: Payer: Self-pay | Admitting: Cardiology

## 2014-12-09 ENCOUNTER — Telehealth: Payer: Self-pay | Admitting: Cardiology

## 2014-12-09 NOTE — Telephone Encounter (Signed)
Mrs.Hamblin passed away on Monday 19-Dec-2014. Per Daughter

## 2014-12-10 DEATH — deceased

## 2014-12-14 ENCOUNTER — Ambulatory Visit: Payer: Medicare Other | Admitting: Cardiology

## 2015-04-14 IMAGING — CR DG FOOT COMPLETE 3+V*R*
3 series · 3 of 3 positions shown · non-contrast
Comparison: None.

CLINICAL DATA: Fall.

EXAM:
RIGHT FOOT COMPLETE - 3+ VIEW

[x foot ap right]
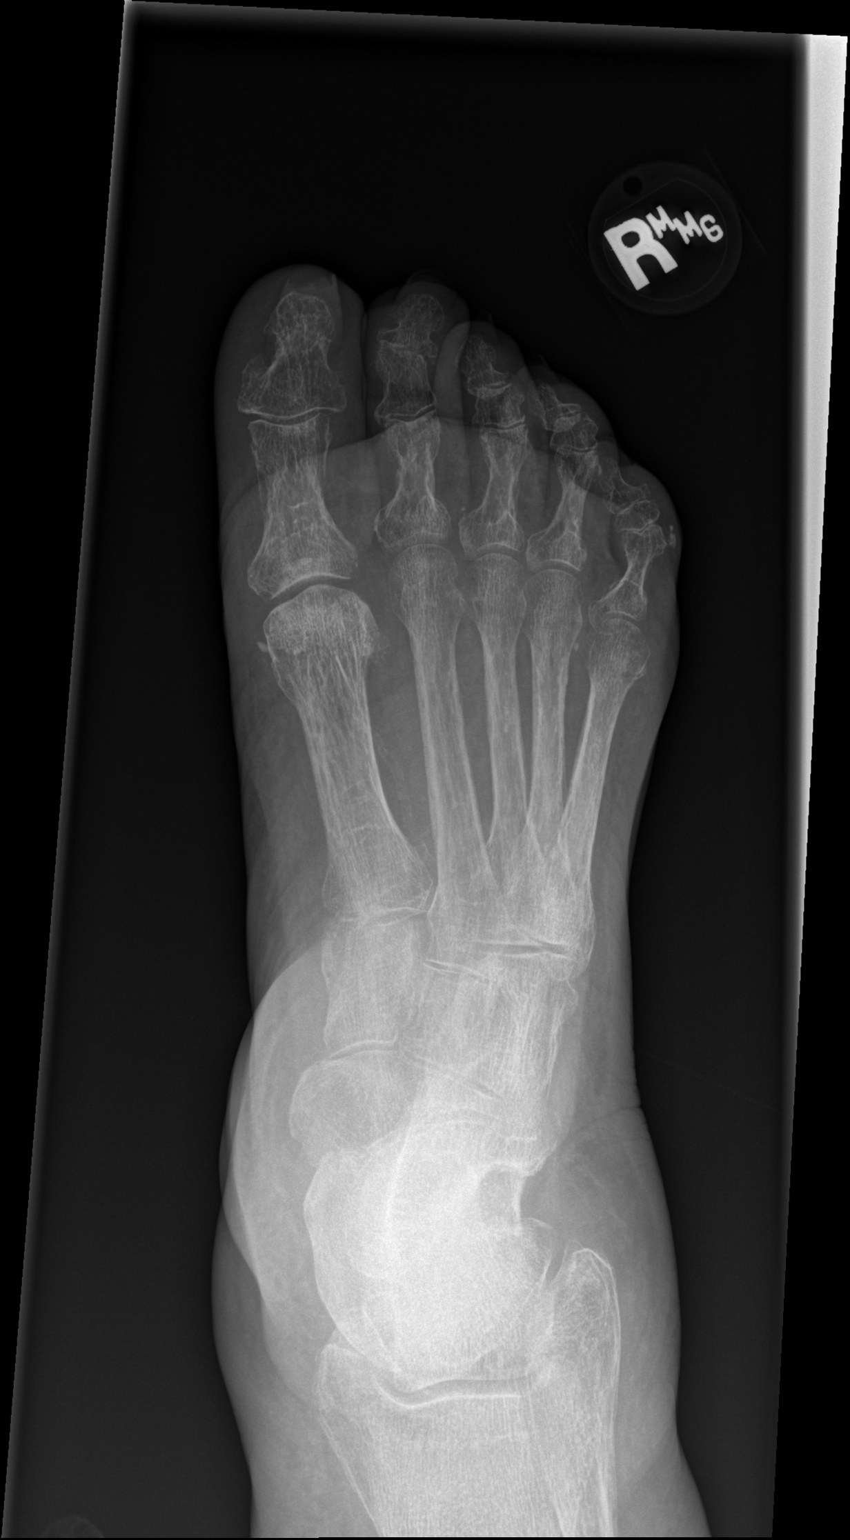

[x foot obl right]
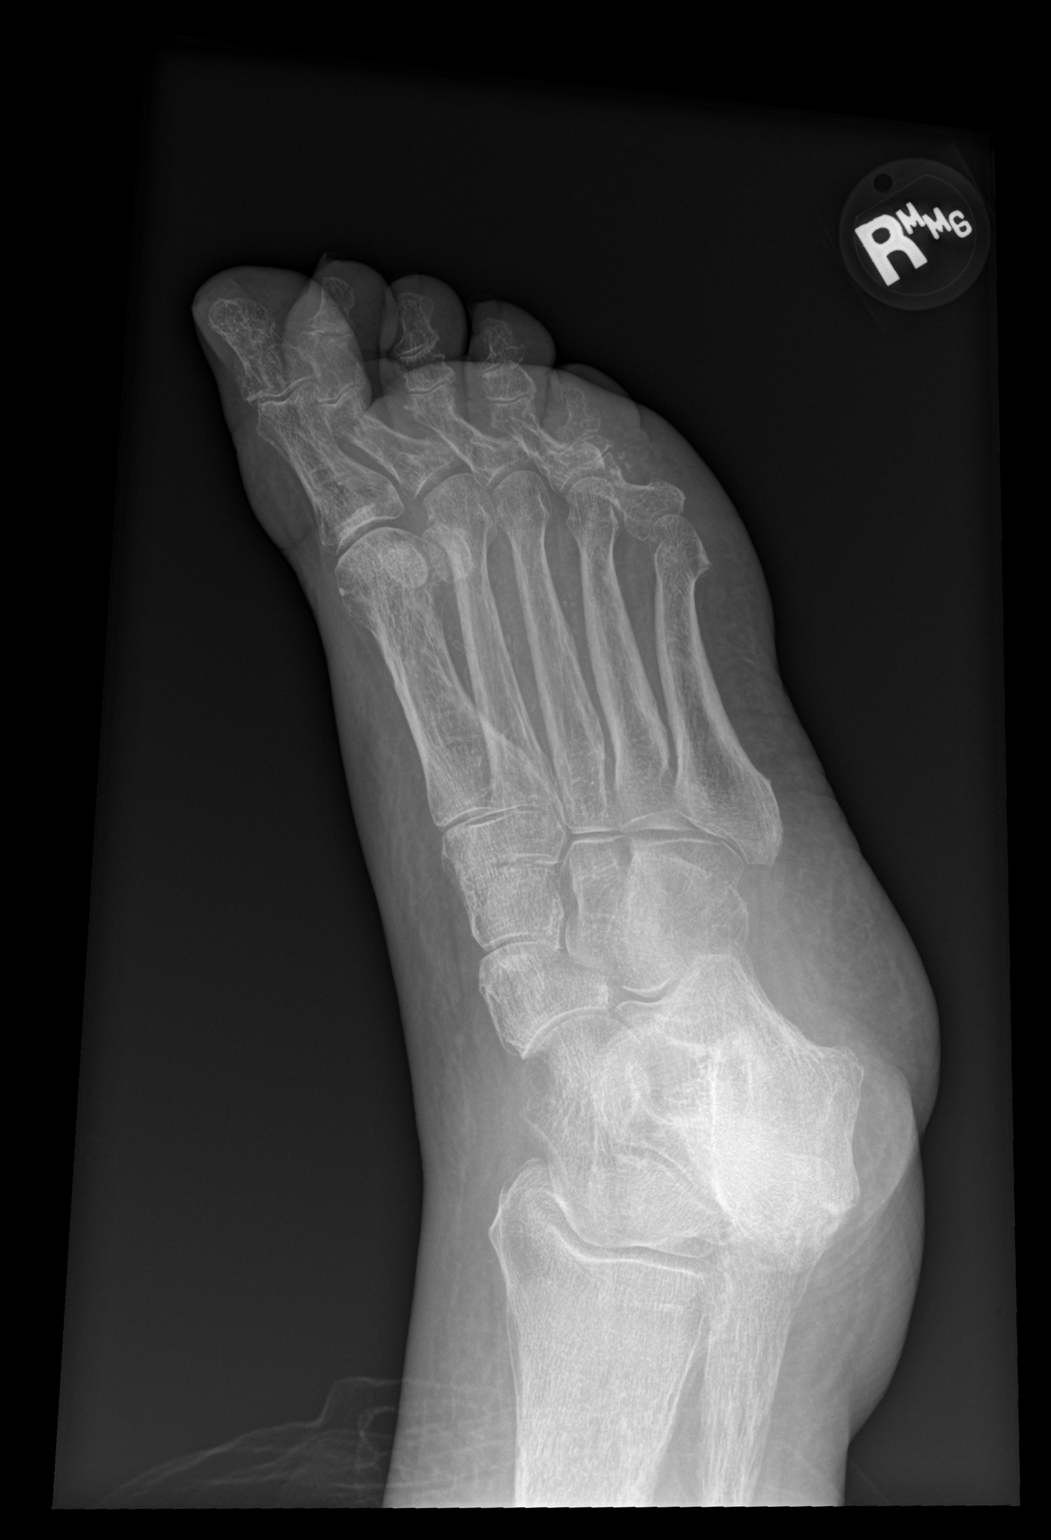

[x foot lat right]
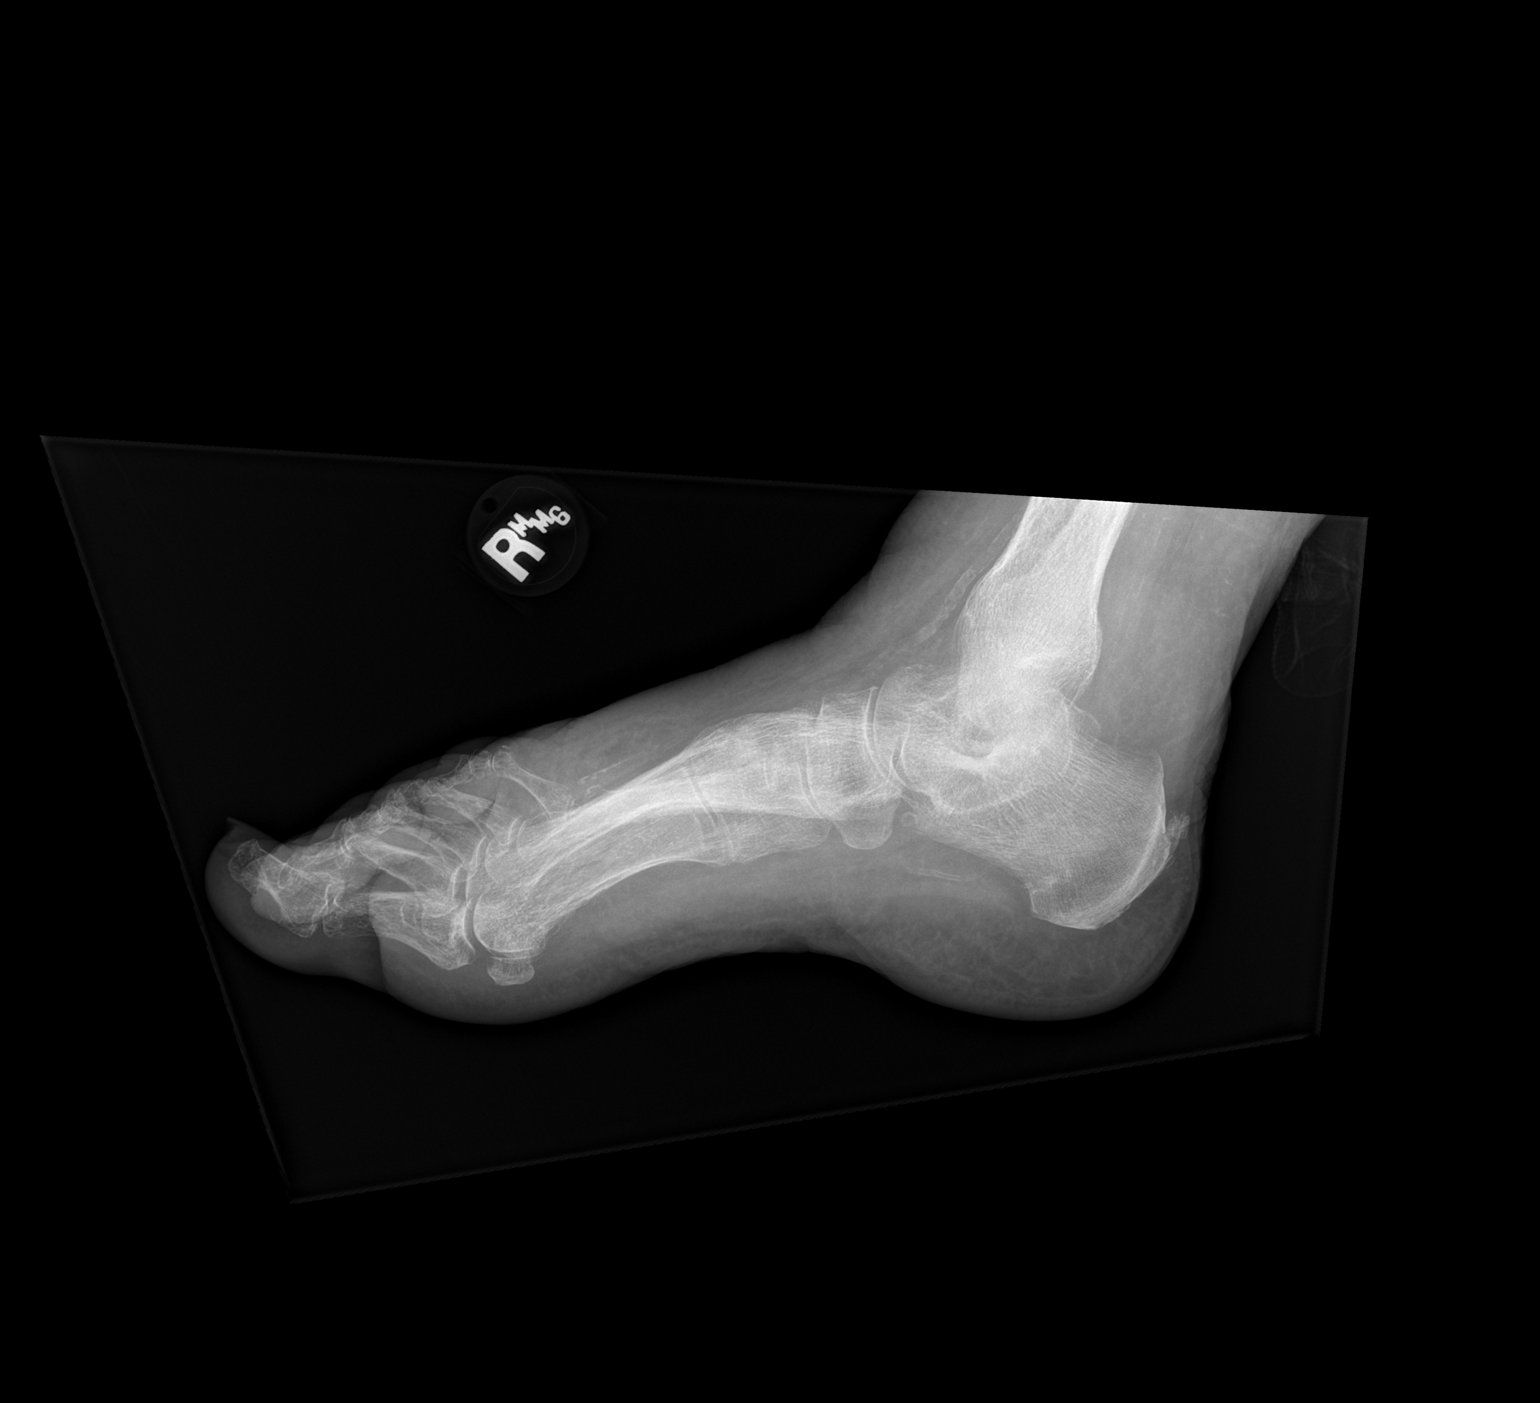

[3 of 3 positions shown; findings below may reference images not displayed]

FINDINGS: Diffuse osteopenia and degenerative change present. No evidence of
fracture or dislocation. Vascular calcifications present consistent
atherosclerotic vascular disease.
IMPRESSION: 1. Diffuse osteopenia and degenerative change. No acute bony
abnormality identified.

2. Atherosclerotic vascular disease.

## 2016-10-19 IMAGING — CR DG CHEST 2V
2 series · 2 of 2 positions shown · non-contrast
Comparison: 11/24/2013 and earlier.

CLINICAL DATA: 86-year-old female with shortness of Breath acute
onset this morning. Initial encounter.

EXAM:
CHEST  2 VIEW

[chest lat]
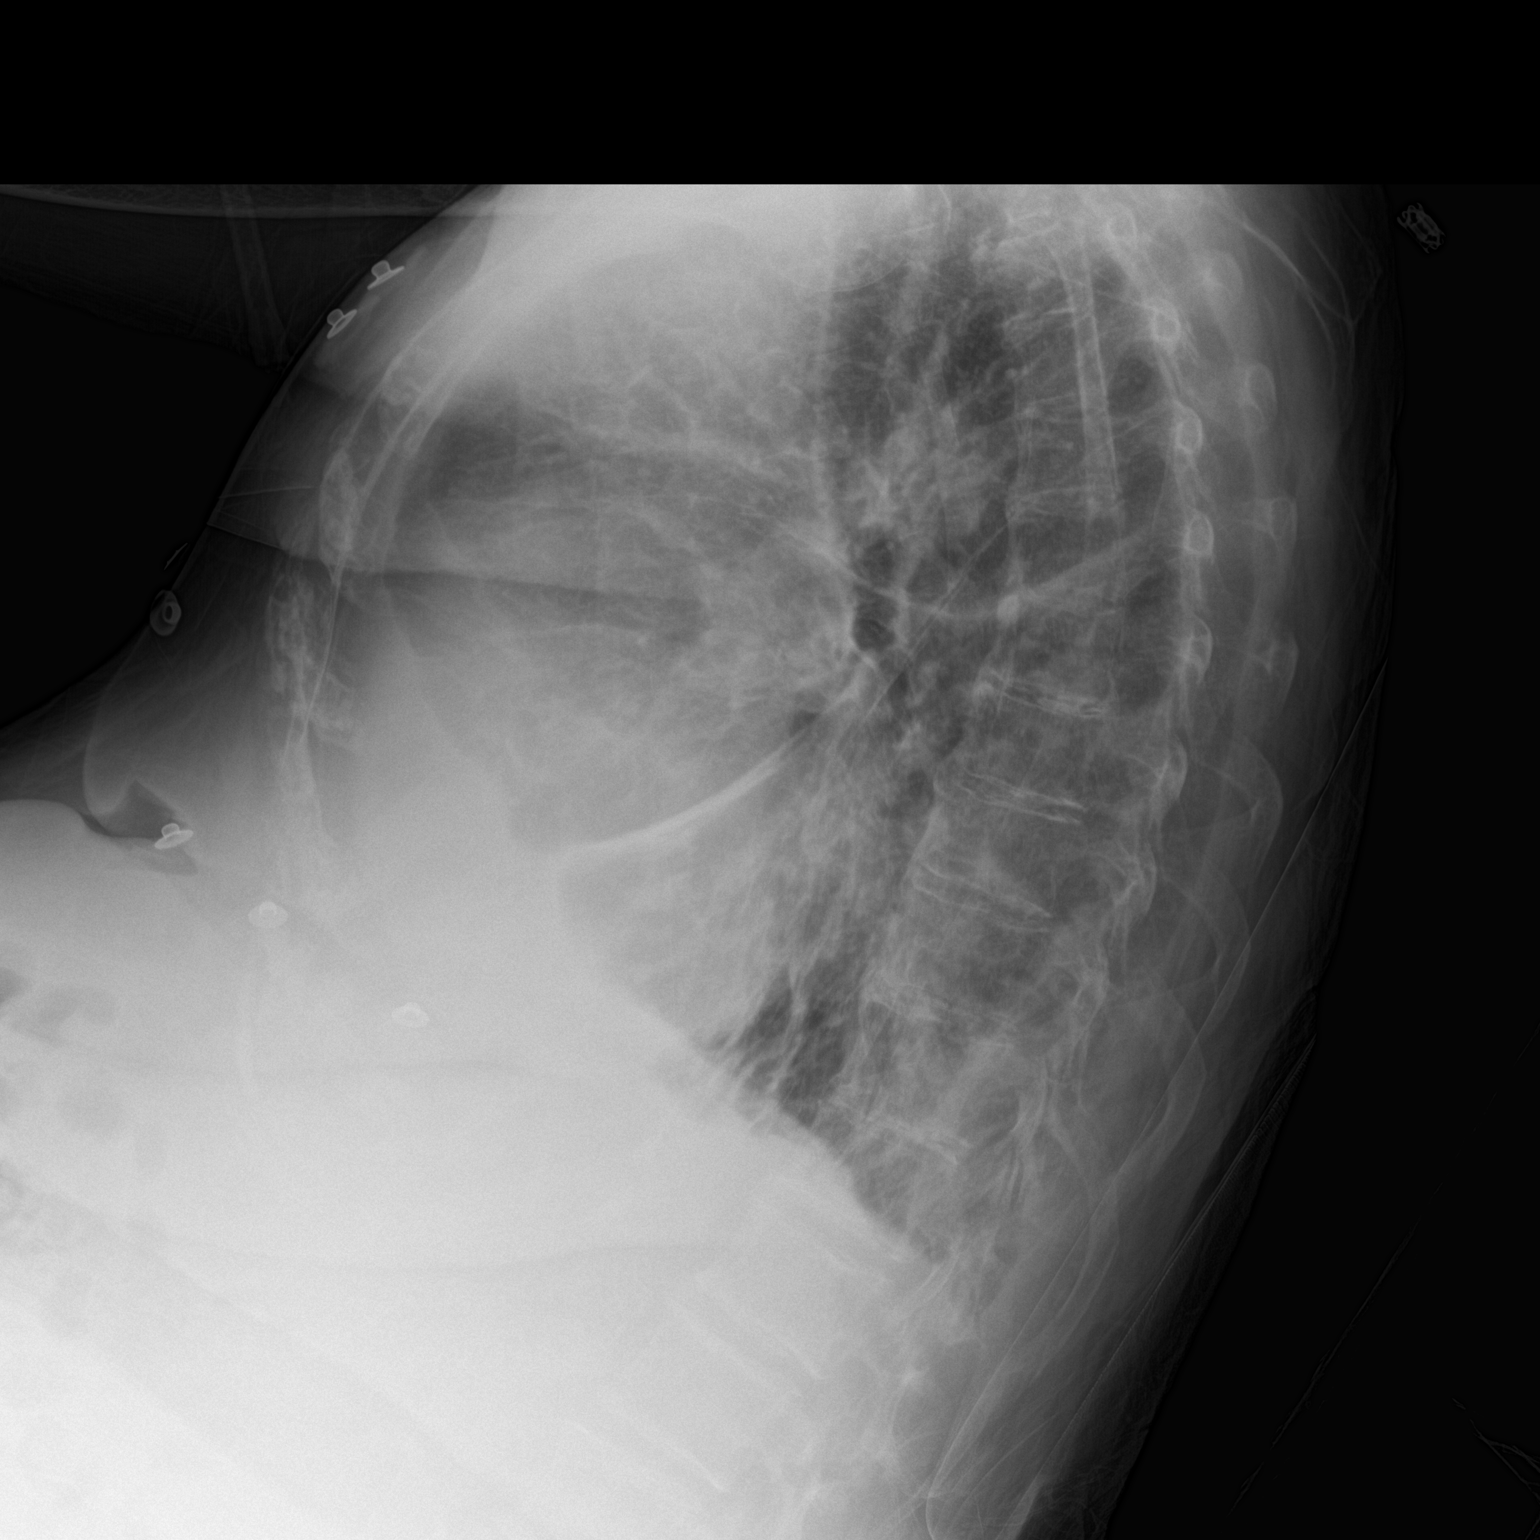

[chest ap]
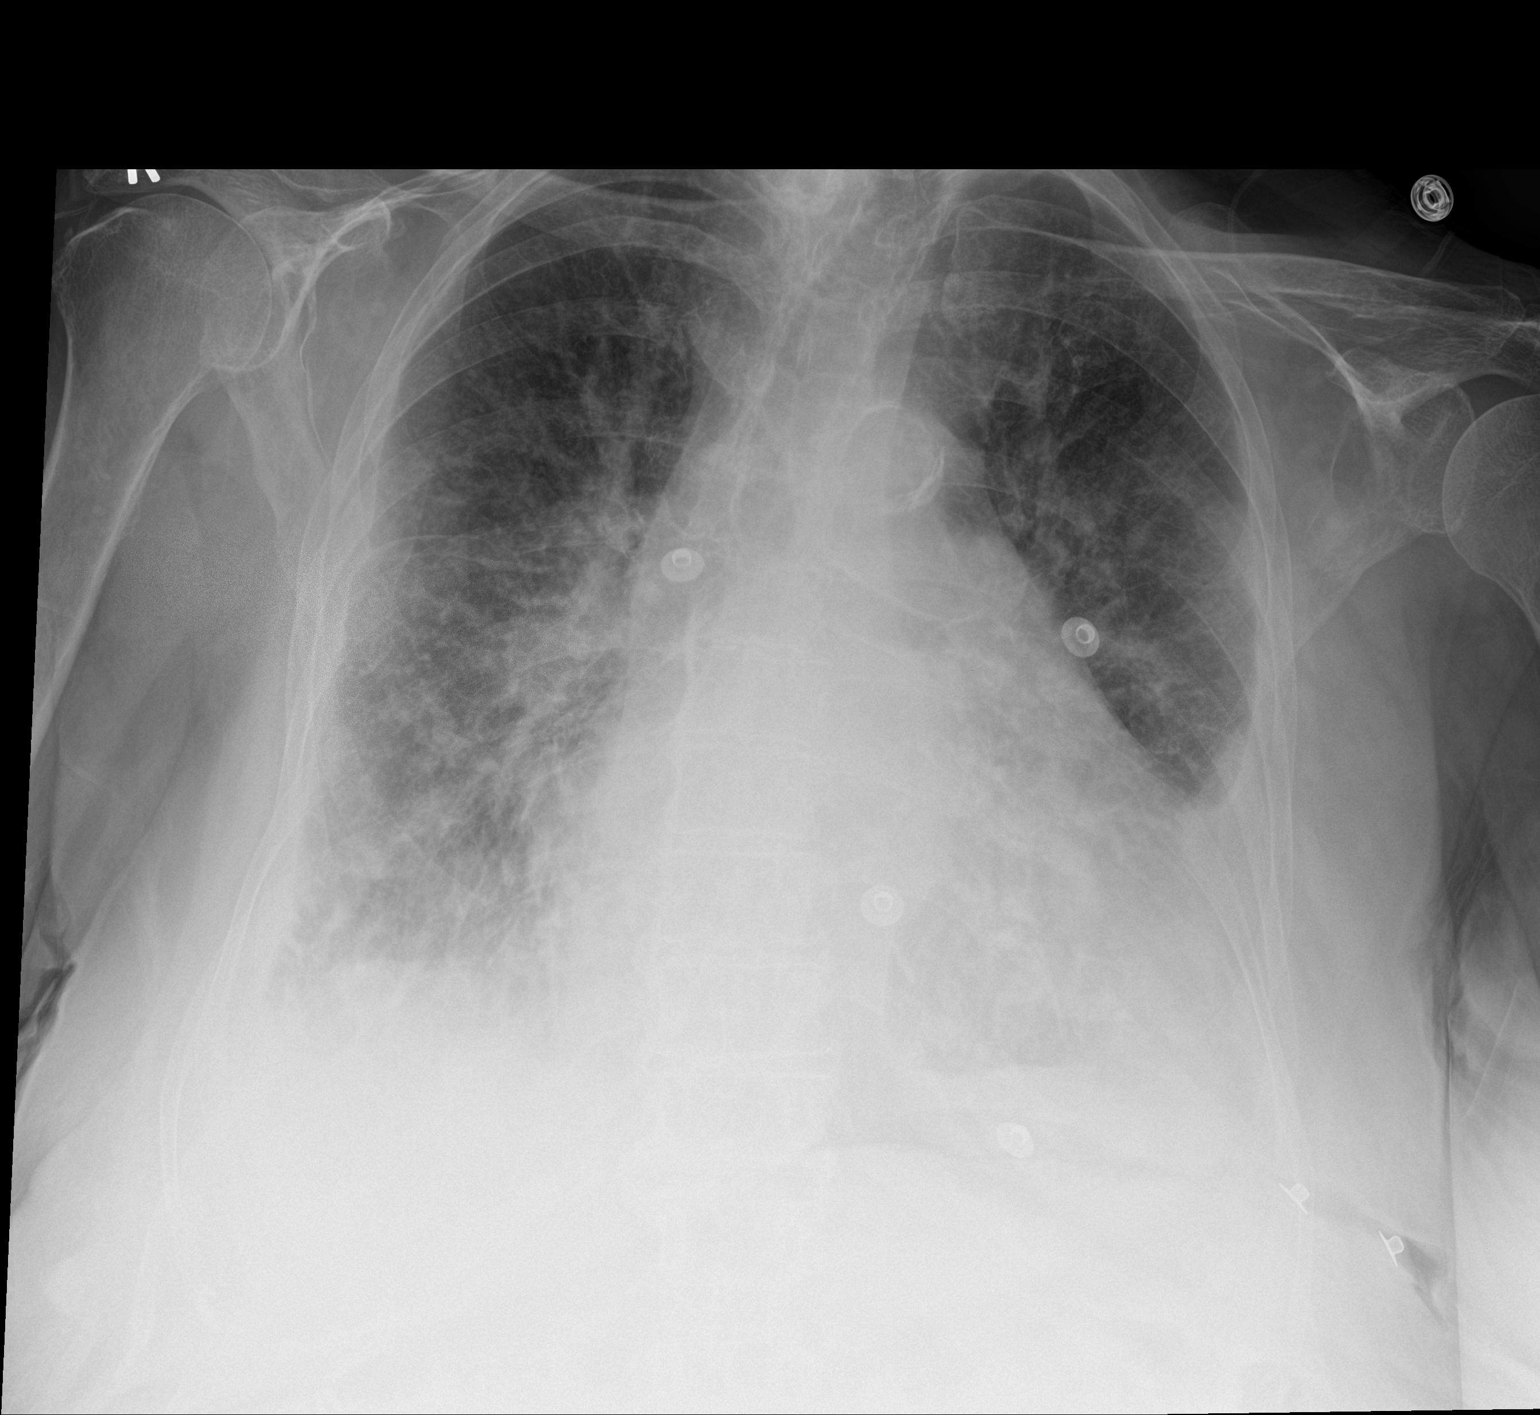

[2 of 2 positions shown; findings below may reference images not displayed]

FINDINGS: Semi upright AP and lateral views of the chest. Chronic moderate to
severe cardiomegaly appears stable. Calcified atherosclerosis of the
aorta. Other mediastinal contours are within normal limits.
Increased pleural fluid, primarily in the fissures. Increased
interstitial markings diffusely. No pneumothorax. No consolidation.
Osteopenia. No acute osseous abnormality identified.
IMPRESSION: Pulmonary interstitial edema with small pleural effusions. Chronic
cardiomegaly. Calcified aortic atherosclerosis.
